# Patient Record
Sex: Female | Born: 1990 | Race: White | Hispanic: No | Marital: Single | Smoking: Former smoker
Health system: Southern US, Community
[De-identification: ages and names within clinical notes are randomized; demographics above are authoritative.]

## PROBLEM LIST (undated history)

## (undated) ENCOUNTER — Ambulatory Visit: Admission: EM | Source: Home / Self Care

## (undated) DIAGNOSIS — F1920 Other psychoactive substance dependence, uncomplicated: Secondary | ICD-10-CM

## (undated) DIAGNOSIS — E669 Obesity, unspecified: Secondary | ICD-10-CM

## (undated) DIAGNOSIS — O039 Complete or unspecified spontaneous abortion without complication: Secondary | ICD-10-CM

## (undated) DIAGNOSIS — O1490 Unspecified pre-eclampsia, unspecified trimester: Secondary | ICD-10-CM

## (undated) HISTORY — DX: Obesity, unspecified: E66.9

## (undated) HISTORY — DX: Other psychoactive substance dependence, uncomplicated: F19.20

## (undated) HISTORY — DX: Complete or unspecified spontaneous abortion without complication: O03.9

---
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
## 2012-03-09 ENCOUNTER — Emergency Department: Payer: Self-pay | Admitting: Emergency Medicine

## 2012-03-09 LAB — URINALYSIS, COMPLETE
Nitrite: NEGATIVE
Protein: NEGATIVE

## 2012-03-09 LAB — LIPASE, BLOOD: Lipase: 78 U/L (ref 73–393)

## 2012-03-09 LAB — COMPREHENSIVE METABOLIC PANEL
Bilirubin,Total: 0.4 mg/dL (ref 0.2–1.0)
Chloride: 110 mmol/L — ABNORMAL HIGH (ref 98–107)
Co2: 23 mmol/L (ref 21–32)
EGFR (African American): >60
EGFR (Non-African Amer.): >60
Glucose: 95 mg/dL (ref 65–99)
Osmolality: 281 (ref 275–301)
SGOT(AST): 20 U/L (ref 0–26)
SGPT (ALT): 26 U/L

## 2012-03-09 LAB — CBC
HCT: 35.8 % (ref 35.0–47.0)
HGB: 11.6 g/dL — ABNORMAL LOW (ref 12.0–16.0)
Platelet: 169 10*3/uL (ref 150–440)

## 2012-10-01 ENCOUNTER — Emergency Department: Payer: Self-pay | Admitting: Emergency Medicine

## 2013-04-08 ENCOUNTER — Emergency Department (HOSPITAL_COMMUNITY)
Admission: EM | Admit: 2013-04-08 | Discharge: 2013-04-08 | Disposition: A | Discharge status: Home / Self Care | Payer: Medicaid Other | Attending: Emergency Medicine | Admitting: Emergency Medicine

## 2013-04-08 ENCOUNTER — Encounter (HOSPITAL_COMMUNITY): Payer: Self-pay | Admitting: *Deleted

## 2013-04-08 DIAGNOSIS — W010XXA Fall on same level from slipping, tripping and stumbling without subsequent striking against object, initial encounter: Secondary | ICD-10-CM | POA: Insufficient documentation

## 2013-04-08 DIAGNOSIS — L0231 Cutaneous abscess of buttock: Secondary | ICD-10-CM | POA: Insufficient documentation

## 2013-04-08 DIAGNOSIS — IMO0002 Reserved for concepts with insufficient information to code with codable children: Secondary | ICD-10-CM | POA: Insufficient documentation

## 2013-04-08 DIAGNOSIS — F17200 Nicotine dependence, unspecified, uncomplicated: Secondary | ICD-10-CM | POA: Insufficient documentation

## 2013-04-08 DIAGNOSIS — L0291 Cutaneous abscess, unspecified: Secondary | ICD-10-CM

## 2013-04-08 DIAGNOSIS — Y939 Activity, unspecified: Secondary | ICD-10-CM | POA: Insufficient documentation

## 2013-04-08 DIAGNOSIS — Y929 Unspecified place or not applicable: Secondary | ICD-10-CM | POA: Insufficient documentation

## 2013-04-08 DIAGNOSIS — R6883 Chills (without fever): Secondary | ICD-10-CM | POA: Insufficient documentation

## 2013-04-08 MED ORDER — DIAZEPAM 5 MG PO TABS
5.0000 mg | ORAL_TABLET | Freq: Once | ORAL | Status: AC
  Administered 2013-04-08: 5 mg via ORAL
  Filled 2013-04-08: qty 1

## 2013-04-08 MED ORDER — IBUPROFEN 800 MG PO TABS: 800.0000 mg | ORAL_TABLET | Freq: Three times a day (TID) | ORAL | Status: DC

## 2013-04-08 MED ORDER — OXYCODONE-ACETAMINOPHEN 5-325 MG PO TABS: 1.0000 | ORAL_TABLET | Freq: Four times a day (QID) | ORAL | Status: DC | PRN

## 2013-04-08 MED ORDER — OXYCODONE-ACETAMINOPHEN 5-325 MG PO TABS
2.0000 | ORAL_TABLET | Freq: Once | ORAL | Status: AC
  Administered 2013-04-08: 2 via ORAL
  Filled 2013-04-08: qty 2

## 2013-04-08 NOTE — ED Provider Notes (Signed)
Medical screening examination/treatment/procedure(s) were conducted as a shared visit with non-physician practitioner(s) and myself.  I personally evaluated the patient during the encounter  Pt with gluteal abscess, likely perianal source.  Pt did fall onto tailbone about 2-3 days ago, possibly skin source after fall and had skin abrasion or break in skin.  Subjective chills.  Pt is not septic.  Will need I&D and follow up with PCP.  See PAC Beck's procedure note for further details.  Impression: Right Gluteal abscess  Bethany Ramsey. Tinleigh Whitmire, MD 04/08/13 1235

## 2013-04-08 NOTE — ED Notes (Signed)
C/o "sore that is hard" to right buttock area since yesterday. Area red, no drainage noted.

## 2013-04-08 NOTE — ED Notes (Signed)
Pt is here with left inner buttocks pain and reports she fell on her tailbone 2 days ago.  Area is red and tender.  Pt is tachycardic

## 2013-04-09 ENCOUNTER — Encounter (HOSPITAL_COMMUNITY): Payer: Self-pay

## 2013-04-09 ENCOUNTER — Emergency Department (HOSPITAL_COMMUNITY)
Admission: EM | Admit: 2013-04-09 | Discharge: 2013-04-09 | Disposition: A | Discharge status: Home / Self Care | Payer: Medicaid Other | Attending: Emergency Medicine | Admitting: Emergency Medicine

## 2013-04-09 DIAGNOSIS — L0231 Cutaneous abscess of buttock: Secondary | ICD-10-CM | POA: Insufficient documentation

## 2013-04-09 DIAGNOSIS — L03317 Cellulitis of buttock: Secondary | ICD-10-CM | POA: Insufficient documentation

## 2013-04-09 DIAGNOSIS — F17200 Nicotine dependence, unspecified, uncomplicated: Secondary | ICD-10-CM | POA: Insufficient documentation

## 2013-04-09 MED ORDER — SULFAMETHOXAZOLE-TMP DS 800-160 MG PO TABS: 1.0000 | ORAL_TABLET | Freq: Two times a day (BID) | ORAL | Status: DC

## 2013-04-09 MED ORDER — OXYCODONE-ACETAMINOPHEN 5-325 MG PO TABS: 1.0000 | ORAL_TABLET | Freq: Once | ORAL | Status: DC

## 2013-04-09 MED ORDER — SULFAMETHOXAZOLE-TMP DS 800-160 MG PO TABS
1.0000 | ORAL_TABLET | Freq: Once | ORAL | Status: AC
  Administered 2013-04-09: 1 via ORAL
  Filled 2013-04-09: qty 1

## 2013-04-09 MED ORDER — ETOMIDATE 2 MG/ML IV SOLN
INTRAVENOUS | Status: AC
  Filled 2013-04-09: qty 20

## 2013-04-09 MED ORDER — SUCCINYLCHOLINE CHLORIDE 20 MG/ML IJ SOLN
INTRAMUSCULAR | Status: AC
  Filled 2013-04-09: qty 1

## 2013-04-09 MED ORDER — ROCURONIUM BROMIDE 50 MG/5ML IV SOLN
INTRAVENOUS | Status: AC
  Filled 2013-04-09: qty 2

## 2013-04-09 MED ORDER — OXYCODONE-ACETAMINOPHEN 5-325 MG PO TABS
2.0000 | ORAL_TABLET | Freq: Once | ORAL | Status: AC
  Administered 2013-04-09: 2 via ORAL
  Filled 2013-04-09: qty 2

## 2013-04-09 MED ORDER — LIDOCAINE HCL (CARDIAC) 20 MG/ML IV SOLN
INTRAVENOUS | Status: AC
  Filled 2013-04-09: qty 5

## 2013-04-09 NOTE — ED Notes (Signed)
Would like her abscess rechecked, pt. Had it drained yesterday and was given only 2 percocets.  Continues to have sharp .  Pt. Needs pain medication.  Also feels that the abscess is firm to touch

## 2013-04-09 NOTE — ED Provider Notes (Signed)
History    This chart was scribed for non-physician practitioner, Arnoldo Hooker PA-C working with Glynn Octave, MD by Donne Anon, ED Scribe. This patient was seen in room TR11C/TR11C and the patient's care was started at 1545.   CSN: [id number]  Arrival date & time 04/09/13  1328   First MD Initiated Contact with Patient 04/09/13 1545      Chief Complaint  Patient presents with  . Abscess     The history is provided by the patient. No language interpreter was used.   HPI Comments: Bethany Ramsey is a 22 y.o. female who presents to the Emergency Department complaining of gradual onset, gradually worsening pain to her right buttock described as sharp. She was seen in the ED yesterday and had it drained but reports it was not packed. She reports she was only given 2 Percocet's which have provided little relief. She states she was not prescribed antibiotics. She reports she is otherwise healthy.    History reviewed. No pertinent past medical history.  History reviewed. No pertinent past surgical history.  No family history on file.  History  Substance Use Topics  . Smoking status: Current Every Day Smoker    Types: Cigarettes  . Smokeless tobacco: Not on file  . Alcohol Use: No    OB History   Grav Para Term Preterm Abortions TAB SAB Ect Mult Living                  Review of Systems  Skin: Positive for wound.  All other systems reviewed and are negative.    Allergies  Review of patient's allergies indicates no known allergies.  Home Medications   Current Outpatient Rx  Name  Route  Sig  Dispense  Refill  . ibuprofen (ADVIL,MOTRIN) 800 MG tablet   Oral   Take 1 tablet (800 mg total) by mouth 3 (three) times daily.   21 tablet   0   . oxyCODONE-acetaminophen (PERCOCET) 5-325 MG per tablet   Oral   Take 1 tablet by mouth every 6 (six) hours as needed for pain.   2 tablet   0     BP 145/90  Pulse 94  Temp(Src) 97.7 F (36.5 C) (Oral)  Resp 16   SpO2 100%  LMP 03/31/2013  Physical Exam  Nursing note and vitals reviewed. Constitutional: She is oriented to person, place, and time. She appears well-developed and well-nourished. No distress.  HENT:  Head: Normocephalic and atraumatic.  Eyes: EOM are normal.  Neck: Neck supple. No tracheal deviation present.  Cardiovascular: Normal rate.   Pulmonary/Chest: Effort normal. No respiratory distress.  Musculoskeletal: Normal range of motion.  Neurological: She is alert and oriented to person, place, and time.  Skin: Skin is warm and dry.  Pilonidal abscess right buttock that measures approximately 6x3 cm of induration. No active drainage. No surrounding cellulitic changes. Exquisitely tender to palpation.  Psychiatric: She has a normal mood and affect. Her behavior is normal.    ED Course  Procedures (including critical care time) DIAGNOSTIC STUDIES: Oxygen Saturation is 100% on RA, normal by my interpretation.    COORDINATION OF CARE: 3:56 PM Discussed treatment plan which includes I&D and antibiotics with pt at bedside and pt agreed to plan.  INCISION AND DRAINAGE Performed by: Arnoldo Hooker PA-C Consent: Verbal consent obtained. Risks and benefits: risks, benefits and alternatives were discussed Type: abscess  Body area: right buttock  Anesthesia: local infiltration  Incision was made with a scalpel.  Local anesthetic: lidocaine 2% with epinephrine  Anesthetic total: 2 ml  Complexity: complex Blunt dissection to break up loculations  Drainage: purulent  Drainage amount: large  Packing material:1inch in iodoform gauze  Patient tolerance: Patient tolerated the procedure well with no immediate complications.     Labs Reviewed - No data to display No results found.   No diagnosis found.  1. Cutaneous abscess  MDM  Uncomplicated cutaneous abscess      I personally performed the services described in this documentation, which was scribed in my  presence. The recorded information has been reviewed and is accurate.     Arnoldo Hooker, PA-C 04/09/13 1753

## 2013-04-10 NOTE — ED Provider Notes (Signed)
Medical screening examination/treatment/procedure(s) were performed by non-physician practitioner and as supervising physician I was immediately available for consultation/collaboration.  Glynn Octave, MD 04/10/13 [id number]

## 2013-04-11 ENCOUNTER — Emergency Department (HOSPITAL_COMMUNITY)
Admission: EM | Admit: 2013-04-11 | Discharge: 2013-04-11 | Disposition: A | Discharge status: Home / Self Care | Payer: Medicaid Other | Attending: Emergency Medicine | Admitting: Emergency Medicine

## 2013-04-11 ENCOUNTER — Encounter (HOSPITAL_COMMUNITY): Payer: Self-pay | Admitting: Emergency Medicine

## 2013-04-11 DIAGNOSIS — F17200 Nicotine dependence, unspecified, uncomplicated: Secondary | ICD-10-CM | POA: Insufficient documentation

## 2013-04-11 DIAGNOSIS — Z5189 Encounter for other specified aftercare: Secondary | ICD-10-CM

## 2013-04-11 DIAGNOSIS — Z4801 Encounter for change or removal of surgical wound dressing: Secondary | ICD-10-CM | POA: Insufficient documentation

## 2013-04-11 NOTE — ED Notes (Signed)
Pt states that she is here today because she needs to have her wound checked. The pt was here on Tuesday to get an abscess packed and dressed, and reports that she was told to come back today to have the packing taken out.

## 2013-04-11 NOTE — ED Provider Notes (Signed)
History  This chart was scribed for non-physician practitioner, Hector Shade, working with Dione Booze, MD by Ardeen Jourdain, ED Scribe. This patient was seen in room TR07C/TR07C and the patient's care was started at 1544.  CSN: [id number]  Arrival date & time 04/11/13  1328   First MD Initiated Contact with Patient 04/11/13 1544      Chief Complaint  Patient presents with  . Wound Check     The history is provided by the patient. No language interpreter was used.    HPI Comments: Galilea Quito is a 22 y.o. female who presents to the Emergency Department complaining of an abscess recheck. She states she was evaulated here in [facility] for the abscess 3 days ago. She was treated with an I&D and packing. She states she was told to return today to have the packing removed. She states she was prescribed Bactrim and is taking her medication as prescribed. She denies any nausea, emesis, diarrhea, fever and pain at the site as associated symptoms.   History reviewed. No pertinent past medical history.  History reviewed. No pertinent past surgical history.  History reviewed. No pertinent family history.  History  Substance Use Topics  . Smoking status: Current Every Day Smoker -- 1.00 packs/day    Types: Cigarettes  . Smokeless tobacco: Not on file  . Alcohol Use: No   No OB history available.   Review of Systems  Skin: Positive for wound.       Abscess recheck   All other systems reviewed and are negative.    Allergies  Review of patient's allergies indicates no known allergies.  Home Medications   Current Outpatient Rx  Name  Route  Sig  Dispense  Refill  . ibuprofen (ADVIL,MOTRIN) 800 MG tablet   Oral   Take 800 mg by mouth every 8 (eight) hours as needed for pain.         Marland Kitchen oxyCODONE-acetaminophen (PERCOCET/ROXICET) 5-325 MG per tablet   Oral   Take 1 tablet by mouth every 4 (four) hours as needed for pain.         Marland Kitchen sulfamethoxazole-trimethoprim (BACTRIM DS)  800-160 MG per tablet   Oral   Take 1 tablet by mouth 2 (two) times daily.           Triage Vitals: BP 127/59  Pulse 70  Temp(Src) 98.2 F (36.8 C)  SpO2 95%  LMP 03/31/2013  Physical Exam  Nursing note and vitals reviewed. Constitutional: She is oriented to person, place, and time. She appears well-developed and well-nourished. No distress.  HENT:  Head: Normocephalic and atraumatic.  Eyes: EOM are normal. Pupils are equal, round, and reactive to light.  Neck: Normal range of motion. Neck supple. No tracheal deviation present.  Cardiovascular: Normal rate, regular rhythm and normal heart sounds.  Exam reveals no gallop and no friction rub.   No murmur heard. Pulmonary/Chest: Effort normal and breath sounds normal. No respiratory distress. She has no wheezes. She has no rales. She exhibits no tenderness.  Abdominal: Soft. Bowel sounds are normal. She exhibits no distension. There is no tenderness.  Musculoskeletal: Normal range of motion. She exhibits no edema.  Neurological: She is alert and oriented to person, place, and time.  Skin: Skin is warm and dry. She is not diaphoretic.  Well healing open abscess with linear incision to right buttock in gluteal cleft. Scant amount of serosanguineous drainage. No warmth or erythema or tenderness to area  Psychiatric: She has a normal mood  and affect. Her behavior is normal.    ED Course  Procedures (including critical care time)  DIAGNOSTIC STUDIES: Oxygen Saturation is 95% on room air, adequate by my interpretation.    COORDINATION OF CARE:  3:56 PM-Discussed treatment plan which includes instructions for home care with pt at bedside and pt agreed to plan.    Labs Reviewed - No data to display No results found.   1. Wound check, abscess       MDM  Abscess appears to be healing well.  No packing was visualized on exam.  Open linear incision draining scant seroanguinous fluid.  No signs of cellulitis. No warmth,  erythema, or tenderness.  Pt still taking and tolerating bactrim.  Discharged home.  No need for f/u unless symptoms return.  Finish bactrim prescription and warm bath soaks to help remaining drainage from abscess. Pt verbalized understanding and agreement with tx plan.  Vitals: unremarkable. Discharged in stable condition.    I personally performed the services described in this documentation, which was scribed in my presence. The recorded information has been reviewed and is accurate.   Junius Finner, PA-C 04/12/13 1541

## 2013-04-13 NOTE — ED Provider Notes (Signed)
Medical screening examination/treatment/procedure(s) were performed by non-physician practitioner and as supervising physician I was immediately available for consultation/collaboration.  Mackenze Grandison, MD 04/13/13 0022 

## 2013-04-16 NOTE — ED Provider Notes (Signed)
Medical screening examination/treatment/procedure(s) were performed by non-physician practitioner and as supervising physician I was immediately available for consultation/collaboration.   Krysti Hickling Y. Oney Folz, MD 04/16/13 1507 

## 2013-04-16 NOTE — ED Provider Notes (Signed)
History     CSN: [id number]  Arrival date & time 04/08/13  6213   First MD Initiated Contact with Patient 04/08/13 1008      Chief Complaint  Patient presents with  . Abscess    (Consider location/radiation/quality/duration/timing/severity/associated sxs/prior treatment) HPI Comments: 22 y.o. Female with no significant medical hx presents today complaining of pain to the tailbone area s/p slipping and falling on her tailbone two days ago. New problem. Acute onset, severe pain, gradually worsening. Pt states it hurts to sit or lay down on her back. Pt admits regular daily bowel movements and subjective chills. Denies fever, hematochezia, nausea, vomiting, numbness, loss of bowel/bladder control, saddle paresthesia. Pt has taken no interventions for this problem.   Patient is a 22 y.o. female presenting with abscess. The history is provided by the patient.  Abscess Location:  Ano-genital Ano-genital abscess location:  R buttock ((note correction to nursing note)) Size:  3cm x 6cm Abscess quality: fluctuance, painful and redness   Abscess quality: not draining, no induration, no itching, no warmth and not weeping   Red streaking: no   Duration:  2 days Progression:  Worsening Pain details:    Quality:  Sharp   Severity:  Severe   Duration:  2 days   Timing:  Constant   Progression:  Worsening Chronicity:  New Context: skin injury   Relieved by:  None tried Exacerbated by: siting, laying down on back. Ineffective treatments:  None tried Associated symptoms: no fever, no headaches, no nausea and no vomiting   Associated symptoms comment:  Subjective chills Risk factors: no hx of MRSA and no prior abscess     History reviewed. No pertinent past medical history.  History reviewed. No pertinent past surgical history.  No family history on file.  History  Substance Use Topics  . Smoking status: Current Every Day Smoker -- 1.00 packs/day    Types: Cigarettes  . Smokeless  tobacco: Not on file  . Alcohol Use: No    OB History   Grav Para Term Preterm Abortions TAB SAB Ect Mult Living                  Review of Systems  Constitutional: Positive for chills. Negative for fever and diaphoresis.  HENT: Negative for neck pain and neck stiffness.   Eyes: Negative for visual disturbance.  Respiratory: Negative for apnea, chest tightness and shortness of breath.   Cardiovascular: Negative for chest pain and palpitations.  Gastrointestinal: Negative for nausea, vomiting, diarrhea and constipation.  Genitourinary: Negative for dysuria.  Musculoskeletal: Positive for back pain. Negative for gait problem.       Tailbone pain s/p fall  Skin:       Red, tender, painful abscess to right buttock  Neurological: Negative for dizziness, weakness, light-headedness, numbness and headaches.    Allergies  Review of patient's allergies indicates no known allergies.  Home Medications   Current Outpatient Rx  Name  Route  Sig  Dispense  Refill  . ibuprofen (ADVIL,MOTRIN) 800 MG tablet   Oral   Take 800 mg by mouth every 8 (eight) hours as needed for pain.         Marland Kitchen oxyCODONE-acetaminophen (PERCOCET/ROXICET) 5-325 MG per tablet   Oral   Take 1 tablet by mouth every 4 (four) hours as needed for pain.         Marland Kitchen sulfamethoxazole-trimethoprim (BACTRIM DS) 800-160 MG per tablet   Oral   Take 1 tablet by mouth 2 (two)  times daily.           BP 109/63  Pulse 75  Temp(Src) 98.9 F (37.2 C) (Oral)  Resp 16  SpO2 99%  LMP 03/31/2013  Physical Exam  Nursing note and vitals reviewed. Constitutional: She is oriented to person, place, and time. She appears well-developed and well-nourished. No distress.  HENT:  Head: Normocephalic and atraumatic.  Eyes: Conjunctivae and EOM are normal.  Neck: Normal range of motion. Neck supple.  No meningeal signs  Cardiovascular: Normal rate, normal heart sounds and intact distal pulses.  Exam reveals no gallop and no  friction rub.   No murmur heard. Tachycardic to 120s  Pulmonary/Chest: Effort normal and breath sounds normal. No respiratory distress. She has no wheezes. She has no rales. She exhibits no tenderness.  Abdominal: Soft. Bowel sounds are normal. She exhibits no distension. There is no tenderness. There is no rebound and no guarding.  Musculoskeletal: Normal range of motion. She exhibits no edema and no tenderness.  FROM to upper and lower extremities No step-offs noted on C-spine No tenderness to palpation of the spinous processes of the C-spine, T-spine or L-spine Full range of motion of C-spine, T-spine or L-spine   Neurological: She is alert and oriented to person, place, and time. No cranial nerve deficit.  Speech is clear and goal oriented, follows commands Sensation normal to light touch Moves extremities without ataxia, coordination intact Normal gait and balance Normal strength in upper and lower extremities bilaterally including dorsiflexion and plantar flexion, strong and equal grip strength   Skin: Skin is warm, dry and intact. She is not diaphoretic. There is erythema.     3cm x 6cm fluctuant abscess. Fluid pockets noted with ultrasound. Erythematous. Tender to palpation. No weeping. No warmth. No edema. No red streaking.  Psychiatric:  anxious    ED Course  Procedures (including critical care time) INCISION AND DRAINAGE Performed by: Glade Nurse Consent: Verbal consent obtained. Risks and benefits: risks, benefits and alternatives were discussed Type: abscess  Body area: right gluteal fold  Anesthesia: local infiltration  Incision was made with a scalpel.  Local anesthetic: lidocaine 2% w epinephrine  Anesthetic total: 6 ml  Complexity: complex Blunt dissection to break up loculations  Drainage: serosanguineous  Drainage amount: moderate  Packing material: none  Patient tolerance: Patient tolerated the procedure well with no immediate  complications.    Labs Reviewed - No data to display No results found.   1. Abscess       MDM  21 y.o. Female with no significant medical hx presents today complaining of pain to the tailbone area s/p slipping and falling on her tailbone two days ago. Not immediately apparent on physical exam, closer examination and use of ultrasound did detect fluid pockets under the surface of the skin of the right gluteal fold. 3cm x 6cm area amenable to I&D. Moderate amount of serosanguineous fluid drained. No signs of infection. No abx needed at this time. Pain managed in ED. Pt tolerated procedure well. On re-evaluation, pt HR in the 80s. Pt given pain meds, instructions to follow up with PCP. Care instructions included use of sanitary napkins to absorb remaining drainage (also given in ED), sitz baths, and thorough cleaning with bowel movements.  Discussed reasons to seek immediate care. Patient expresses understanding and agrees with plan. Pt case seen by and discussed with Dr. Oletta Lamas who agrees with plan.    Glade Nurse, PA-C 04/16/13 1358

## 2014-02-24 ENCOUNTER — Emergency Department: Payer: Self-pay | Admitting: Emergency Medicine

## 2014-02-24 LAB — URINALYSIS, COMPLETE
BACTERIA: NONE SEEN
Bilirubin,UR: NEGATIVE
Glucose,UR: NEGATIVE mg/dL (ref 0–75)
Ketone: NEGATIVE
NITRITE: NEGATIVE
Ph: 6 (ref 4.5–8.0)
Protein: NEGATIVE
RBC,UR: 4 /HPF (ref 0–5)
Specific Gravity: 1.021 (ref 1.003–1.030)
WBC UR: 16 /HPF (ref 0–5)

## 2014-02-24 LAB — GC/CHLAMYDIA PROBE AMP

## 2014-02-24 LAB — PREGNANCY, URINE: Pregnancy Test, Urine: NEGATIVE m[IU]/mL

## 2014-02-24 LAB — WET PREP, GENITAL

## 2014-11-26 ENCOUNTER — Emergency Department: Payer: Self-pay | Admitting: Emergency Medicine

## 2014-11-26 LAB — COMPREHENSIVE METABOLIC PANEL
ALBUMIN: 4.1 g/dL (ref 3.4–5.0)
ALK PHOS: 84 U/L
AST: 33 U/L (ref 15–37)
Anion Gap: 7 (ref 7–16)
BUN: 9 mg/dL (ref 7–18)
Bilirubin,Total: 1 mg/dL (ref 0.2–1.0)
CALCIUM: 9.1 mg/dL (ref 8.5–10.1)
CO2: 24 mmol/L (ref 21–32)
Chloride: 107 mmol/L (ref 98–107)
Creatinine: 0.7 mg/dL (ref 0.60–1.30)
EGFR (African American): >60
GLUCOSE: 106 mg/dL — AB (ref 65–99)
Osmolality: 275 (ref 275–301)
Potassium: 3.8 mmol/L (ref 3.5–5.1)
SGPT (ALT): 53 U/L
Sodium: 138 mmol/L (ref 136–145)
Total Protein: 7.4 g/dL (ref 6.4–8.2)

## 2014-11-26 LAB — CBC
HCT: 42.4 % (ref 35.0–47.0)
HGB: 14.1 g/dL (ref 12.0–16.0)
MCH: 31 pg (ref 26.0–34.0)
MCHC: 33.2 g/dL (ref 32.0–36.0)
MCV: 93 fL (ref 80–100)
Platelet: 250 10*3/uL (ref 150–440)
RBC: 4.54 10*6/uL (ref 3.80–5.20)
RDW: 12.9 % (ref 11.5–14.5)
WBC: 9.6 10*3/uL (ref 3.6–11.0)

## 2016-03-11 ENCOUNTER — Emergency Department: Payer: Medicaid Other

## 2016-03-11 ENCOUNTER — Emergency Department
Admission: EM | Admit: 2016-03-11 | Discharge: 2016-03-11 | Disposition: A | Discharge status: Home / Self Care | Payer: Medicaid Other | Attending: Emergency Medicine | Admitting: Emergency Medicine

## 2016-03-11 ENCOUNTER — Encounter: Payer: Self-pay | Admitting: Emergency Medicine

## 2016-03-11 DIAGNOSIS — Z79899 Other long term (current) drug therapy: Secondary | ICD-10-CM | POA: Insufficient documentation

## 2016-03-11 DIAGNOSIS — R0789 Other chest pain: Secondary | ICD-10-CM

## 2016-03-11 DIAGNOSIS — F1290 Cannabis use, unspecified, uncomplicated: Secondary | ICD-10-CM | POA: Insufficient documentation

## 2016-03-11 DIAGNOSIS — F17210 Nicotine dependence, cigarettes, uncomplicated: Secondary | ICD-10-CM | POA: Insufficient documentation

## 2016-03-11 DIAGNOSIS — I10 Essential (primary) hypertension: Secondary | ICD-10-CM | POA: Insufficient documentation

## 2016-03-11 LAB — BASIC METABOLIC PANEL
Anion gap: 11 (ref 5–15)
BUN: 9 mg/dL (ref 6–20)
CALCIUM: 9.7 mg/dL (ref 8.9–10.3)
CHLORIDE: 106 mmol/L (ref 101–111)
CO2: 24 mmol/L (ref 22–32)
CREATININE: 0.61 mg/dL (ref 0.44–1.00)
GFR calc Af Amer: >60 mL/min (ref >60)
GFR calc non Af Amer: >60 mL/min (ref >60)
Glucose, Bld: 100 mg/dL — ABNORMAL HIGH (ref 65–99)
Potassium: 4.2 mmol/L (ref 3.5–5.1)
SODIUM: 141 mmol/L (ref 135–145)

## 2016-03-11 LAB — CBC
HCT: 42.6 % (ref 35.0–47.0)
Hemoglobin: 14.3 g/dL (ref 12.0–16.0)
MCH: 30.7 pg (ref 26.0–34.0)
MCHC: 33.6 g/dL (ref 32.0–36.0)
MCV: 91.6 fL (ref 80.0–100.0)
PLATELETS: 235 10*3/uL (ref 150–440)
RBC: 4.65 MIL/uL (ref 3.80–5.20)
RDW: 12.9 % (ref 11.5–14.5)
WBC: 9.1 10*3/uL (ref 3.6–11.0)

## 2016-03-11 LAB — TROPONIN I: Troponin I: <0.03 ng/mL (ref <0.031)

## 2016-03-11 MED ORDER — NAPROXEN 500 MG PO TABS: 500.0000 mg | ORAL_TABLET | Freq: Two times a day (BID) | ORAL | Status: DC

## 2016-03-11 MED ORDER — LISINOPRIL-HYDROCHLOROTHIAZIDE 10-12.5 MG PO TABS: 1.0000 | ORAL_TABLET | Freq: Every day | ORAL | Status: DC

## 2016-03-11 NOTE — ED Provider Notes (Signed)
[facility] Emergency Department Provider Note  ____________________________________________    I have reviewed the triage vital signs and the nursing notes.   HISTORY  Chief Complaint Chest Pain    HPI Bethany Ramsey is a 25 y.o. female who presents with complaints of central chest aching sensation that is mild to moderate. It seems to get worse when she takes deep breath. It is been going on for approximately 2 days. No injury to the area. No recent illness. No shortness of breath. No calf pain. No history of DVT. She is not on blood clots. She has not been taking her blood pressure medication for her known hypertension. No headache. Patient is currently menstruating   Past Medical History  Diagnosis Date  . Hypertension     There are no active problems to display for this patient.   History reviewed. No pertinent past surgical history.  Current Outpatient Rx  Name  Route  Sig  Dispense  Refill  . ibuprofen (ADVIL,MOTRIN) 800 MG tablet   Oral   Take 800 mg by mouth every 8 (eight) hours as needed for pain.         Marland Kitchen lisinopril-hydrochlorothiazide (PRINZIDE,ZESTORETIC) 10-12.5 MG tablet   Oral   Take 1 tablet by mouth daily.   30 tablet   0   . naproxen (NAPROSYN) 500 MG tablet   Oral   Take 1 tablet (500 mg total) by mouth 2 (two) times daily with a meal.   20 tablet   2   . oxyCODONE-acetaminophen (PERCOCET/ROXICET) 5-325 MG per tablet   Oral   Take 1 tablet by mouth every 4 (four) hours as needed for pain.         Marland Kitchen sulfamethoxazole-trimethoprim (BACTRIM DS) 800-160 MG per tablet   Oral   Take 1 tablet by mouth 2 (two) times daily.           Allergies Review of patient's allergies indicates no known allergies.  History reviewed. No pertinent family history.  Social History Social History  Substance Use Topics  . Smoking status: Current Every Day Smoker -- 1.00 packs/day    Types: Cigarettes  . Smokeless tobacco: None   . Alcohol Use: No    Review of Systems  Constitutional: Negative for fever. Eyes: Negative for redness ENT: Negative for sore throat Cardiovascular: As above Respiratory: Negative for shortness of breath. Gastrointestinal: Negative for abdominal pain Genitourinary: Negative for dysuria. Musculoskeletal: Negative for back pain. Skin: Negative for rash. Neurological: Negative for focal weakness, no headache Psychiatric: no anxiety    ____________________________________________   PHYSICAL EXAM:  VITAL SIGNS: ED Triage Vitals  Enc Vitals Group     BP 03/11/16 1203 186/109 mmHg     Pulse Rate 03/11/16 1203 93     Resp 03/11/16 1203 20     Temp 03/11/16 1203 98.4 F (36.9 C)     Temp Source 03/11/16 1203 Oral     SpO2 03/11/16 1203 98 %     Weight 03/11/16 1203 250 lb (113.399 kg)     Height 03/11/16 1203  (1.753 m)     Head Cir --      Peak Flow --      Pain Score 03/11/16 1205 0     Pain Loc --      Pain Edu? --      Excl. in GC? --     Constitutional: Alert and oriented. Well appearing and in no distress.  Eyes: Conjunctivae are normal. No  erythema or injection ENT   Head: Normocephalic and atraumatic.   Mouth/Throat: Mucous membranes are moist. Cardiovascular: Normal rate, regular rhythm. Normal and symmetric distal pulses are present in the upper extremities. No murmurs or rubs , bilateral sternal tenderness to palpation that mimics her discomfort. Respiratory: Normal respiratory effort without tachypnea nor retractions. Breath sounds are clear and equal bilaterally.  Gastrointestinal: Soft and non-tender in all quadrants. No distention. There is no CVA tenderness. Genitourinary: deferred Musculoskeletal: Nontender with normal range of motion in all extremities. No lower extremity tenderness nor edema.  Neurologic:  Normal speech and language. No gross focal neurologic deficits are appreciated. Skin:  Skin is warm, dry and intact. No rash  noted. Psychiatric: Mood and affect are normal. Patient exhibits appropriate insight and judgment.  ____________________________________________    LABS (pertinent positives/negatives)  Labs Reviewed  BASIC METABOLIC PANEL - Abnormal; Notable for the following:    Glucose, Bld 100 (*)    All other components within normal limits  CBC  TROPONIN I    ____________________________________________   EKG  ED ECG REPORT I, Jene EveryKINNER, Norma Montemurro, the attending physician, personally viewed and interpreted this ECG.  Date: 03/11/2016 EKG Time: 12:01 PM Rate: 86 Rhythm: normal sinus rhythm QRS Axis: normal Intervals: normal ST/T Wave abnormalities: normal Conduction Disturbances: none Narrative Interpretation: unremarkable   ____________________________________________    RADIOLOGY  Chest x-ray is normal ____________________________________________   PROCEDURES  Procedure(s) performed: none  Critical Care performed: none  ____________________________________________   INITIAL IMPRESSION / ASSESSMENT AND PLAN / ED COURSE  Pertinent labs & imaging results that were available during my care of the patient were reviewed by me and considered in my medical decision making (see chart for details).  Patient presents with chest wall discomfort for approximately 2 days. Her EKG is normal. Her lab work is normal. PERC negative. She has tenderness to her chest wall and mild pleurisy in the area which makes me suspicious for costochondritis/chest wall pain. She has a history of hypertension and is not on her blood pressure medication. I discussed this with her and I will prescribe her her typical medication so that she started again. We will treat her chest pain with NSAIDs. I have asked her to follow up with her PCP or if any worsening to return to the emergency department immediately and she agrees with this plan.  ____________________________________________   FINAL CLINICAL  IMPRESSION(S) / ED DIAGNOSES  Final diagnoses:  Acute chest wall pain          Jene Everyobert Anoushka Divito, MD 03/11/16 1606

## 2016-03-11 NOTE — ED Notes (Signed)
Pt to ed with c/o central chest pain that started yesterday,  Pt states intermittent sharp and shooting chest pains throughout the day with associated sob, denies weakness, denies dizziness, denies diaphoresis, denies n/v.  Pt states she is supposed to be on bp meds (lisinopril/HCTZ) but stopped them over 1 year ago.

## 2016-03-11 NOTE — Discharge Instructions (Signed)

## 2016-04-14 ENCOUNTER — Emergency Department
Admission: EM | Admit: 2016-04-14 | Discharge: 2016-04-14 | Disposition: A | Discharge status: Home / Self Care | Payer: No Typology Code available for payment source | Attending: Emergency Medicine | Admitting: Emergency Medicine

## 2016-04-14 ENCOUNTER — Emergency Department: Payer: No Typology Code available for payment source

## 2016-04-14 DIAGNOSIS — R51 Headache: Secondary | ICD-10-CM | POA: Diagnosis not present

## 2016-04-14 DIAGNOSIS — F1290 Cannabis use, unspecified, uncomplicated: Secondary | ICD-10-CM | POA: Insufficient documentation

## 2016-04-14 DIAGNOSIS — S0033XA Contusion of nose, initial encounter: Secondary | ICD-10-CM | POA: Diagnosis not present

## 2016-04-14 DIAGNOSIS — S00531A Contusion of lip, initial encounter: Secondary | ICD-10-CM | POA: Insufficient documentation

## 2016-04-14 DIAGNOSIS — Y9389 Activity, other specified: Secondary | ICD-10-CM | POA: Insufficient documentation

## 2016-04-14 DIAGNOSIS — Z79899 Other long term (current) drug therapy: Secondary | ICD-10-CM | POA: Diagnosis not present

## 2016-04-14 DIAGNOSIS — S0992XA Unspecified injury of nose, initial encounter: Secondary | ICD-10-CM | POA: Diagnosis present

## 2016-04-14 DIAGNOSIS — F17210 Nicotine dependence, cigarettes, uncomplicated: Secondary | ICD-10-CM | POA: Insufficient documentation

## 2016-04-14 DIAGNOSIS — Y999 Unspecified external cause status: Secondary | ICD-10-CM | POA: Diagnosis not present

## 2016-04-14 DIAGNOSIS — Y92410 Unspecified street and highway as the place of occurrence of the external cause: Secondary | ICD-10-CM | POA: Diagnosis not present

## 2016-04-14 DIAGNOSIS — I10 Essential (primary) hypertension: Secondary | ICD-10-CM | POA: Insufficient documentation

## 2016-04-14 MED ORDER — KETOROLAC TROMETHAMINE 30 MG/ML IJ SOLN
30.0000 mg | Freq: Once | INTRAMUSCULAR | Status: AC
  Administered 2016-04-14: 30 mg via INTRAMUSCULAR
  Filled 2016-04-14: qty 1

## 2016-04-14 MED ORDER — HYDROCODONE-ACETAMINOPHEN 5-325 MG PO TABS
1.0000 | ORAL_TABLET | Freq: Once | ORAL | Status: AC
  Administered 2016-04-14: 1 via ORAL
  Filled 2016-04-14: qty 1

## 2016-04-14 MED ORDER — HYDROCODONE-ACETAMINOPHEN 5-325 MG PO TABS: 1.0000 | ORAL_TABLET | Freq: Four times a day (QID) | ORAL | Status: DC | PRN

## 2016-04-14 MED ORDER — IBUPROFEN 600 MG PO TABS: 600.0000 mg | ORAL_TABLET | Freq: Four times a day (QID) | ORAL | Status: DC | PRN
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
## 2018-04-11 ENCOUNTER — Other Ambulatory Visit: Payer: Self-pay

## 2018-04-11 ENCOUNTER — Encounter: Payer: Self-pay | Admitting: Emergency Medicine

## 2018-04-11 ENCOUNTER — Emergency Department
Admission: EM | Admit: 2018-04-11 | Discharge: 2018-04-11 | Disposition: A | Discharge status: Home / Self Care | Payer: Self-pay | Attending: Emergency Medicine | Admitting: Emergency Medicine

## 2018-04-11 DIAGNOSIS — Y9389 Activity, other specified: Secondary | ICD-10-CM | POA: Insufficient documentation

## 2018-04-11 DIAGNOSIS — Z79899 Other long term (current) drug therapy: Secondary | ICD-10-CM | POA: Insufficient documentation

## 2018-04-11 DIAGNOSIS — Y999 Unspecified external cause status: Secondary | ICD-10-CM | POA: Insufficient documentation

## 2018-04-11 DIAGNOSIS — F17210 Nicotine dependence, cigarettes, uncomplicated: Secondary | ICD-10-CM | POA: Insufficient documentation

## 2018-04-11 DIAGNOSIS — T310 Burns involving less than 10% of body surface: Secondary | ICD-10-CM | POA: Insufficient documentation

## 2018-04-11 DIAGNOSIS — X19XXXA Contact with other heat and hot substances, initial encounter: Secondary | ICD-10-CM | POA: Insufficient documentation

## 2018-04-11 DIAGNOSIS — Y929 Unspecified place or not applicable: Secondary | ICD-10-CM | POA: Insufficient documentation

## 2018-04-11 DIAGNOSIS — I10 Essential (primary) hypertension: Secondary | ICD-10-CM | POA: Insufficient documentation

## 2018-04-11 DIAGNOSIS — T24201A Burn of second degree of unspecified site of right lower limb, except ankle and foot, initial encounter: Secondary | ICD-10-CM

## 2018-04-11 DIAGNOSIS — T24221A Burn of second degree of right knee, initial encounter: Secondary | ICD-10-CM | POA: Insufficient documentation

## 2018-04-11 MED ORDER — IBUPROFEN 600 MG PO TABS: 600.0000 mg | ORAL_TABLET | Freq: Three times a day (TID) | ORAL | 0 refills | Status: DC | PRN

## 2018-04-11 MED ORDER — SILVER SULFADIAZINE 1 % EX CREA
TOPICAL_CREAM | Freq: Once | CUTANEOUS | Status: AC
  Administered 2018-04-11: 1 via TOPICAL

## 2018-04-11 MED ORDER — SILVER SULFADIAZINE 1 % EX CREA: TOPICAL_CREAM | CUTANEOUS | 0 refills | Status: DC

## 2018-04-11 MED ORDER — OXYCODONE-ACETAMINOPHEN 5-325 MG PO TABS
1.0000 | ORAL_TABLET | Freq: Once | ORAL | Status: AC
  Administered 2018-04-11: 1 via ORAL
  Filled 2018-04-11: qty 1

## 2018-04-11 MED ORDER — IBUPROFEN 600 MG PO TABS
600.0000 mg | ORAL_TABLET | Freq: Once | ORAL | Status: AC
  Administered 2018-04-11: 600 mg via ORAL
  Filled 2018-04-11: qty 1

## 2018-04-11 MED ORDER — SILVER SULFADIAZINE 1 % EX CREA
TOPICAL_CREAM | CUTANEOUS | Status: AC
  Filled 2018-04-11: qty 85

## 2018-04-11 MED ORDER — TRAMADOL HCL 50 MG PO TABS: 50.0000 mg | ORAL_TABLET | Freq: Two times a day (BID) | ORAL | 0 refills | Status: DC | PRN
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
## 2018-04-11 NOTE — ED Notes (Signed)
Burn area cleansed with saline   silvadene dressing applied  Tolerated well
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
## 2018-04-12 ENCOUNTER — Other Ambulatory Visit: Payer: Self-pay

## 2018-04-12 ENCOUNTER — Emergency Department
Admission: EM | Admit: 2018-04-12 | Discharge: 2018-04-12 | Disposition: A | Discharge status: Home / Self Care | Payer: Self-pay | Attending: Emergency Medicine | Admitting: Emergency Medicine

## 2018-04-12 ENCOUNTER — Encounter: Payer: Self-pay | Admitting: Emergency Medicine

## 2018-04-12 DIAGNOSIS — F17210 Nicotine dependence, cigarettes, uncomplicated: Secondary | ICD-10-CM | POA: Insufficient documentation

## 2018-04-12 DIAGNOSIS — Z5189 Encounter for other specified aftercare: Secondary | ICD-10-CM

## 2018-04-12 DIAGNOSIS — Z4800 Encounter for change or removal of nonsurgical wound dressing: Secondary | ICD-10-CM | POA: Insufficient documentation

## 2018-04-12 DIAGNOSIS — X58XXXA Exposure to other specified factors, initial encounter: Secondary | ICD-10-CM | POA: Insufficient documentation

## 2018-04-12 DIAGNOSIS — Z79899 Other long term (current) drug therapy: Secondary | ICD-10-CM | POA: Insufficient documentation

## 2018-04-12 DIAGNOSIS — I10 Essential (primary) hypertension: Secondary | ICD-10-CM | POA: Insufficient documentation

## 2018-04-12 MED ORDER — SILVER SULFADIAZINE 1 % EX CREA
TOPICAL_CREAM | CUTANEOUS | Status: AC
  Administered 2018-04-12: 1 via TOPICAL
  Filled 2018-04-12: qty 85

## 2018-04-12 MED ORDER — SILVER SULFADIAZINE 1 % EX CREA
TOPICAL_CREAM | Freq: Once | CUTANEOUS | Status: AC
  Administered 2018-04-12: 1 via TOPICAL

## 2018-04-12 MED ORDER — OXYCODONE-ACETAMINOPHEN 5-325 MG PO TABS: 1.0000 | ORAL_TABLET | Freq: Four times a day (QID) | ORAL | 0 refills | Status: DC | PRN

## 2018-04-12 MED ORDER — SULFAMETHOXAZOLE-TRIMETHOPRIM 800-160 MG PO TABS: 1.0000 | ORAL_TABLET | Freq: Two times a day (BID) | ORAL | 0 refills | Status: DC

## 2018-04-12 MED ORDER — SULFAMETHOXAZOLE-TRIMETHOPRIM 800-160 MG PO TABS
1.0000 | ORAL_TABLET | Freq: Once | ORAL | Status: AC
Start: 2018-04-12 — End: 2018-04-12
  Administered 2018-04-12: 1 via ORAL
  Filled 2018-04-12: qty 1

## 2018-04-12 MED ORDER — OXYCODONE-ACETAMINOPHEN 5-325 MG PO TABS
1.0000 | ORAL_TABLET | Freq: Once | ORAL | Status: AC
  Administered 2018-04-12: 1 via ORAL
  Filled 2018-04-12: qty 1

## 2018-04-12 NOTE — ED Provider Notes (Signed)
[facility] Emergency Department Provider Note   ____________________________________________   First MD Initiated Contact with Patient 04/12/18 1356     (approximate)  I have reviewed the triage vital signs and the nursing notes.   HISTORY  Chief Complaint Wound Check    HPI Bethany Ramsey is a 27 y.o. female patient here for recheck of burn to the left leg.  Patient initially seen this department yesterday which was 3 day status post injury.  Patient state increased redness and swelling.  Patient state following discharge care instruction and using Silvadene as directed.  Patient rates pain as a 7/10.  Patient described the pain is "sore".  Past Medical History:  Diagnosis Date  . Hypertension     There are no active problems to display for this patient.   History reviewed. No pertinent surgical history.  Prior to Admission medications   Medication Sig Start Date End Date Taking? Authorizing Provider  HYDROcodone-acetaminophen (NORCO) 5-325 MG tablet Take 1 tablet by mouth every 6 (six) hours as needed for severe pain. 04/14/16   Hagler, Jami L, PA-C  ibuprofen (ADVIL,MOTRIN) 600 MG tablet Take 1 tablet (600 mg total) by mouth every 6 (six) hours as needed. 04/14/16   Hagler, Jami L, PA-C  ibuprofen (ADVIL,MOTRIN) 600 MG tablet Take 1 tablet (600 mg total) by mouth every 8 (eight) hours as needed. 04/11/18   Joni Reining, PA-C  lisinopril-hydrochlorothiazide (PRINZIDE,ZESTORETIC) 10-12.5 MG tablet Take 1 tablet by mouth daily. 03/11/16   Jene Every, MD  naproxen (NAPROSYN) 500 MG tablet Take 1 tablet (500 mg total) by mouth 2 (two) times daily with a meal. 03/11/16   Jene Every, MD  oxyCODONE-acetaminophen (PERCOCET/ROXICET) 5-325 MG per tablet Take 1 tablet by mouth every 4 (four) hours as needed for pain.    [provider]  silver sulfADIAZINE (SILVADENE) 1 % cream Apply to affected area daily 04/11/18 04/11/19  Joni Reining, PA-C    sulfamethoxazole-trimethoprim (BACTRIM DS) 800-160 MG per tablet Take 1 tablet by mouth 2 (two) times daily.    [provider]  sulfamethoxazole-trimethoprim (BACTRIM DS,SEPTRA DS) 800-160 MG tablet Take 1 tablet by mouth 2 (two) times daily. 04/12/18   Joni Reining, PA-C  traMADol (ULTRAM) 50 MG tablet Take 1 tablet (50 mg total) by mouth every 12 (twelve) hours as needed. 04/11/18   Joni Reining, PA-C    Allergies Patient has no known allergies.  No family history on file.  Social History Social History   Tobacco Use  . Smoking status: Current Every Day Smoker    Packs/day: 1.00    Types: Cigarettes  . Smokeless tobacco: Never Used  Substance Use Topics  . Alcohol use: No  . Drug use: Yes    Types: Marijuana    Review of Systems Constitutional: No fever/chills Eyes: No visual changes. ENT: No sore throat. Cardiovascular: Denies chest pain. Respiratory: Denies shortness of breath. Gastrointestinal: No abdominal pain.  No nausea, no vomiting.  No diarrhea.  No constipation. Genitourinary: Negative for dysuria. Musculoskeletal: Negative for back pain. Skin: Ruptured bullous lesion on erythematous base. Neurological: Negative for headaches, focal weakness or numbness.   ____________________________________________   PHYSICAL EXAM:  VITAL SIGNS: ED Triage Vitals  Enc Vitals Group     BP 04/12/18 1357 (!) 159/108     Pulse Rate 04/12/18 1357 (!) 103     Resp 04/12/18 1357 18     Temp 04/12/18 1357 98.4 F (36.9 C)  Temp Source 04/12/18 1357 Oral     SpO2 04/12/18 1357 99 %     Weight 04/12/18 1356 182 lb (82.6 kg)     Height 04/12/18 1356  (1.753 m)     Head Circumference --      Peak Flow --      Pain Score 04/12/18 1400 7     Pain Loc --      Pain Edu? --      Excl. in GC? --     Constitutional: Alert and oriented. Well appearing and in no acute distress. Cardiovascular: Normal rate, regular rhythm. Grossly normal heart sounds.   Good peripheral circulation. Respiratory: Normal respiratory effort.  No retractions. Lungs CTAB. Neurologic:  Normal speech and language. No gross focal neurologic deficits are appreciated. No gait instability. Skin: Wound is not  covered and no Silvadene ointment present.  Patient states she removed the ointment prior to arrival so we can see the redness.   Psychiatric: Mood and affect are normal. Speech and behavior are normal.  ____________________________________________   LABS (all labs ordered are listed, but only abnormal results are displayed)  Labs Reviewed - No data to display ____________________________________________  EKG   ____________________________________________  RADIOLOGY  ED MD interpretation:    Official radiology report(s): No results found.  ____________________________________________   PROCEDURES  Procedure(s) performed: None  Procedures  Critical Care performed: No  ____________________________________________   INITIAL IMPRESSION / ASSESSMENT AND PLAN / ED COURSE  As part of my medical decision making, I reviewed the following data within the electronic MEDICAL RECORD NUMBER    Cellulitis secondary to burn right leg.  Area is again reclean, Silvadene applied, and re-bandage.  Patient given discharge care instructions.  Due to the increased erythema patient also given prescription for Bactrim DS.  Advised to follow-up as needed.      ____________________________________________   FINAL CLINICAL IMPRESSION(S) / ED DIAGNOSES  Final diagnoses:  Encounter for wound re-check     ED Discharge Orders        Ordered    sulfamethoxazole-trimethoprim (BACTRIM DS,SEPTRA DS) 800-160 MG tablet  2 times daily     04/12/18 1405       Note:  This document was prepared using Dragon voice recognition software and may include unintentional dictation errors.    Joni Reining, PA-C 04/12/18 1414    Sharman Cheek, MD 04/12/18  [phone]

## 2018-04-12 NOTE — ED Notes (Signed)
Burn area cleansed with saline  Silvadene dressing applied well

## 2018-04-12 NOTE — ED Triage Notes (Signed)
Here for recheck of burn to leg  Lower leg is swollen and redness is spreading   Also having increased pain

## 2018-07-22 ENCOUNTER — Encounter: Payer: Self-pay | Admitting: Emergency Medicine

## 2018-07-22 ENCOUNTER — Emergency Department
Admission: EM | Admit: 2018-07-22 | Discharge: 2018-07-22 | Disposition: A | Discharge status: Home / Self Care | Payer: Self-pay | Attending: Emergency Medicine | Admitting: Emergency Medicine

## 2018-07-22 ENCOUNTER — Emergency Department: Payer: Self-pay

## 2018-07-22 ENCOUNTER — Other Ambulatory Visit: Payer: Self-pay

## 2018-07-22 DIAGNOSIS — O039 Complete or unspecified spontaneous abortion without complication: Secondary | ICD-10-CM | POA: Insufficient documentation

## 2018-07-22 DIAGNOSIS — Z3A01 Less than 8 weeks gestation of pregnancy: Secondary | ICD-10-CM | POA: Insufficient documentation

## 2018-07-22 DIAGNOSIS — F17210 Nicotine dependence, cigarettes, uncomplicated: Secondary | ICD-10-CM | POA: Insufficient documentation

## 2018-07-22 DIAGNOSIS — Z79899 Other long term (current) drug therapy: Secondary | ICD-10-CM | POA: Insufficient documentation

## 2018-07-22 DIAGNOSIS — R58 Hemorrhage, not elsewhere classified: Secondary | ICD-10-CM

## 2018-07-22 HISTORY — DX: Unspecified pre-eclampsia, unspecified trimester: O14.90

## 2018-07-22 LAB — COMPREHENSIVE METABOLIC PANEL
ALBUMIN: 3.4 g/dL — AB (ref 3.5–5.0)
ALT: 10 U/L (ref 0–44)
ANION GAP: 8 (ref 5–15)
AST: 13 U/L — ABNORMAL LOW (ref 15–41)
Alkaline Phosphatase: 36 U/L — ABNORMAL LOW (ref 38–126)
BUN: 7 mg/dL (ref 6–20)
CALCIUM: 8.6 mg/dL — AB (ref 8.9–10.3)
CHLORIDE: 105 mmol/L (ref 98–111)
CO2: 23 mmol/L (ref 22–32)
Creatinine, Ser: 0.7 mg/dL (ref 0.44–1.00)
GFR calc non Af Amer: >60 mL/min (ref >60)
GLUCOSE: 88 mg/dL (ref 70–99)
Potassium: 3.9 mmol/L (ref 3.5–5.1)
SODIUM: 136 mmol/L (ref 135–145)
Total Bilirubin: 1.7 mg/dL — ABNORMAL HIGH (ref 0.3–1.2)
Total Protein: 5.9 g/dL — ABNORMAL LOW (ref 6.5–8.1)

## 2018-07-22 LAB — WET PREP, GENITAL
Clue Cells Wet Prep HPF POC: NONE SEEN
SPERM: NONE SEEN
TRICH WET PREP: NONE SEEN
Yeast Wet Prep HPF POC: NONE SEEN

## 2018-07-22 LAB — URINALYSIS, COMPLETE (UACMP) WITH MICROSCOPIC
Specific Gravity, Urine: 1.005 (ref 1.005–1.030)
Squamous Epithelial / LPF: NONE SEEN (ref 0–5)

## 2018-07-22 LAB — CBC
HEMATOCRIT: 35 % (ref 35.0–47.0)
HEMOGLOBIN: 12.4 g/dL (ref 12.0–16.0)
MCH: 33.3 pg (ref 26.0–34.0)
MCHC: 35.3 g/dL (ref 32.0–36.0)
MCV: 94.3 fL (ref 80.0–100.0)
Platelets: 180 10*3/uL (ref 150–440)
RBC: 3.71 MIL/uL — AB (ref 3.80–5.20)
RDW: 13.3 % (ref 11.5–14.5)
WBC: 10.4 10*3/uL (ref 3.6–11.0)

## 2018-07-22 LAB — HCG, QUANTITATIVE, PREGNANCY: hCG, Beta Chain, Quant, S: 15002 m[IU]/mL — ABNORMAL HIGH (ref <5)

## 2018-07-22 LAB — PREGNANCY, URINE: Preg Test, Ur: POSITIVE — AB

## 2018-07-22 MED ORDER — MORPHINE SULFATE (PF) 4 MG/ML IV SOLN
INTRAVENOUS | Status: AC
  Administered 2018-07-22: 4 mg
  Filled 2018-07-22: qty 1

## 2018-07-22 MED ORDER — MORPHINE SULFATE (PF) 4 MG/ML IV SOLN
4.0000 mg | Freq: Once | INTRAVENOUS | Status: AC
  Administered 2018-07-22: 4 mg via INTRAVENOUS
  Filled 2018-07-22: qty 1

## 2018-07-22 MED ORDER — MORPHINE SULFATE (PF) 4 MG/ML IV SOLN: 4.0000 mg | Freq: Once | INTRAVENOUS | Status: DC

## 2018-07-22 MED ORDER — ONDANSETRON HCL 4 MG/2ML IJ SOLN
4.0000 mg | Freq: Once | INTRAMUSCULAR | Status: AC
Start: 2018-07-22 — End: 2018-07-22
  Administered 2018-07-22: 4 mg via INTRAVENOUS
  Filled 2018-07-22: qty 2

## 2018-07-22 NOTE — ED Notes (Signed)
Pt returned from US

## 2018-07-22 NOTE — ED Triage Notes (Signed)
Pt reports being in the middle of miscarrying. Pt states her last menstrual period was at the end of June. Reports she was at the gas station x 3 days ago and experiencing a gush of blood; then bent over in house yesterday and gushed blood at that time. Today passed 5-6 blood clots. Thought she would pass it without issue but is concerned with the amount of blood she has lost.  Fatigue. Headache. C/o "tiredness in legs." Nausea. Cold sweats.

## 2018-07-22 NOTE — ED Provider Notes (Addendum)
[facility] Emergency Department Provider Note   ____________________________________________   First MD Initiated Contact with Patient 07/22/18 Rickey Primus     (approximate)  I have reviewed the triage vital signs and the nursing notes.   HISTORY  Chief Complaint Vaginal Bleeding   HPI Bethany Ramsey is a 27 y.o. female patient reports her last menstrual period was in June.  She has had intermittent cramping and bleeding for the last 3 days.  Today it got much worse.  I saw her initially she went to ultrasound came back and just before I saw her again she passed a large amount of tissue.  Cramping and bleeding subsided dramatically.  Repeat ultrasound showed that she had passed the pregnancy with fetal demise that she had had in her uterus.   Past Medical History:  Diagnosis Date  . Preeclampsia    when pregnant with son 2013    There are no active problems to display for this patient.   History reviewed. No pertinent surgical history.  Prior to Admission medications   Medication Sig Start Date End Date Taking? Authorizing Provider  HYDROcodone-acetaminophen (NORCO) 5-325 MG tablet Take 1 tablet by mouth every 6 (six) hours as needed for severe pain. 04/14/16   Hagler, Jami L, PA-C  ibuprofen (ADVIL,MOTRIN) 600 MG tablet Take 1 tablet (600 mg total) by mouth every 6 (six) hours as needed. 04/14/16   Hagler, Jami L, PA-C  ibuprofen (ADVIL,MOTRIN) 600 MG tablet Take 1 tablet (600 mg total) by mouth every 8 (eight) hours as needed. 04/11/18   Joni Reining, PA-C  lisinopril-hydrochlorothiazide (PRINZIDE,ZESTORETIC) 10-12.5 MG tablet Take 1 tablet by mouth daily. 03/11/16   Jene Every, MD  naproxen (NAPROSYN) 500 MG tablet Take 1 tablet (500 mg total) by mouth 2 (two) times daily with a meal. 03/11/16   Jene Every, MD  oxyCODONE-acetaminophen (PERCOCET) 5-325 MG tablet Take 1 tablet by mouth every 6 (six) hours as needed for severe pain. 04/12/18 04/12/19  Joni Reining, PA-C  oxyCODONE-acetaminophen (PERCOCET/ROXICET) 5-325 MG per tablet Take 1 tablet by mouth every 4 (four) hours as needed for pain.    [provider]  silver sulfADIAZINE (SILVADENE) 1 % cream Apply to affected area daily 04/11/18 04/11/19  Joni Reining, PA-C  sulfamethoxazole-trimethoprim (BACTRIM DS) 800-160 MG per tablet Take 1 tablet by mouth 2 (two) times daily.    [provider]  sulfamethoxazole-trimethoprim (BACTRIM DS,SEPTRA DS) 800-160 MG tablet Take 1 tablet by mouth 2 (two) times daily. 04/12/18   Joni Reining, PA-C  traMADol (ULTRAM) 50 MG tablet Take 1 tablet (50 mg total) by mouth every 12 (twelve) hours as needed. 04/11/18   Joni Reining, PA-C    Allergies Patient has no known allergies.  No family history on file.  Social History Social History   Tobacco Use  . Smoking status: Current Every Day Smoker    Packs/day: 1.00    Types: Cigarettes  . Smokeless tobacco: Never Used  Substance Use Topics  . Alcohol use: No  . Drug use: Yes    Types: Marijuana    Review of Systems  Constitutional: No fever/chills Eyes: No visual changes. ENT: No sore throat. Cardiovascular: Denies chest pain. Respiratory: Denies shortness of breath. Gastrointestinal see HPI Genitourinary: Negative for dysuria. Musculoskeletal: Negative for back pain. Skin: Negative for rash. Neurological: Negative for headaches, focal weakness   ____________________________________________   PHYSICAL EXAM:  VITAL SIGNS: ED Triage Vitals  Enc Vitals Group  BP 07/22/18 1724 118/73     Pulse Rate 07/22/18 1724 97     Resp 07/22/18 1724 14     Temp 07/22/18 1724 98 F (36.7 C)     Temp Source 07/22/18 1724 Oral     SpO2 07/22/18 1724 100 %     Weight 07/22/18 1725 205 lb (93 kg)     Height 07/22/18 1725 5\' 9"  (1.753 m)     Head Circumference --      Peak Flow --      Pain Score 07/22/18 1724 0     Pain Loc --      Pain Edu? --      Excl. in GC? --      Constitutional: Alert and oriented.  In acute distress.  Crying with the cramps she is having Eyes: Conjunctivae are normal.  Head: Atraumatic. Nose: No congestion/rhinnorhea. Mouth/Throat: Mucous membranes are moist.  Oropharynx non-erythematous. Neck: No stridor.  Cardiovascular: Normal rate, regular rhythm. Grossly normal heart sounds.  Good peripheral circulation. Respiratory: Normal respiratory effort.  No retractions. Lungs CTAB. Gastrointestinal: Soft but has some lower abdominal pain and tenderness no distention. No abdominal bruits. No CVA tenderness. Genitourinary: When I saw her again after she passed the tissue her pain was much better.  She had scant blood in the vagina.  The os felt to be fingertip.  There was no cervical motion tenderness or masses. Musculoskeletal: No lower extremity tenderness nor edema.   Neurologic:  Normal speech and language. No gross focal neurologic deficits are appreciated.  Skin:  Skin is warm, dry and intact. No rash noted. Psychiatric: Mood and affect are normal. Speech and behavior are normal.  ____________________________________________   LABS (all labs ordered are listed, but only abnormal results are displayed)  Labs Reviewed  HCG, QUANTITATIVE, PREGNANCY - Abnormal; Notable for the following components:      Result Value   hCG, Beta Chain, Quant, S 15,002 (*)    All other components within normal limits  CBC - Abnormal; Notable for the following components:   RBC 3.71 (*)    All other components within normal limits  URINALYSIS, COMPLETE (UACMP) WITH MICROSCOPIC - Abnormal; Notable for the following components:   Color, Urine RED (*)    APPearance CLOUDY (*)    Glucose, UA   (*)    Value: TEST NOT REPORTED DUE TO COLOR INTERFERENCE OF URINE PIGMENT   Hgb urine dipstick   (*)    Value: TEST NOT REPORTED DUE TO COLOR INTERFERENCE OF URINE PIGMENT   Bilirubin Urine   (*)    Value: TEST NOT REPORTED DUE TO COLOR INTERFERENCE OF  URINE PIGMENT   Ketones, ur   (*)    Value: TEST NOT REPORTED DUE TO COLOR INTERFERENCE OF URINE PIGMENT   Protein, ur   (*)    Value: TEST NOT REPORTED DUE TO COLOR INTERFERENCE OF URINE PIGMENT   Nitrite   (*)    Value: TEST NOT REPORTED DUE TO COLOR INTERFERENCE OF URINE PIGMENT   Leukocytes, UA   (*)    Value: TEST NOT REPORTED DUE TO COLOR INTERFERENCE OF URINE PIGMENT   RBC / HPF >50 (*)    Bacteria, UA FEW (*)    All other components within normal limits  COMPREHENSIVE METABOLIC PANEL - Abnormal; Notable for the following components:   Calcium 8.6 (*)    Total Protein 5.9 (*)    Albumin 3.4 (*)    AST 13 (*)  Alkaline Phosphatase 36 (*)    Total Bilirubin 1.7 (*)    All other components within normal limits  PREGNANCY, URINE - Abnormal; Notable for the following components:   Preg Test, Ur POSITIVE (*)    All other components within normal limits  WET PREP, GENITAL  CHLAMYDIA/NGC RT PCR ([facility] ONLY)  SURGICAL PATHOLOGY   ____________________________________________  EKG  ____________________________________________  RADIOLOGY  ED MD interpretation: Initial ultrasound report is intrauterine fetal demise second ultrasound showed no retained products  Official radiology report(s): US Ob Comp Less 14 Wks  Result Date: 07/22/2018 CLINICAL DATA:  Pelvic cramping and vaginal bleeding for the past 3 days. Unknown last menstrual period. Pregnant with a quantitative beta HCG of 15,002 EXAM: OBSTETRIC <14 WK ULTRASOUND TECHNIQUE: Transabdominal ultrasound was performed for evaluation of the gestation as well as the maternal uterus and adnexal regions. COMPARISON:  None. FINDINGS: Intrauterine gestational sac: Visualized Yolk sac:  Not visualized Embryo:  Visualized Cardiac Activity: Not visualized CRL:   14.7 mm   7 w 6 d                  [place] EDC: 03/04/2019 Subchorionic hemorrhage:  None visualized. Maternal uterus/adnexae: Normal appearing ovaries. No free peritoneal  fluid. IMPRESSION: Single intrauterine gestation with findings compatible with fetal demise at 7 weeks and 6 days of gestation. Critical Value/emergent results were called by telephone at the time of interpretation on 07/22/2018 at 7:59 pm to Dr. Dorothea Glassman , who verbally acknowledged these results. Electronically Signed   By: Beckie Salts M.D.   On: 07/22/2018 19:59    ____________________________________________   PROCEDURES  Procedure(s) performed:   Procedures  Critical Care performed:   ____________________________________________   INITIAL IMPRESSION / ASSESSMENT AND PLAN / ED COURSE I have patient follow-up with Dr. Elesa Massed.  Otherwise she appears to be fine to go.         ____________________________________________   FINAL CLINICAL IMPRESSION(S) / ED DIAGNOSES  Final diagnoses:  Miscarriage     ED Discharge Orders    None       Note:  This document was prepared using Dragon voice recognition software and may include unintentional dictation errors.    Arnaldo Natal, MD 07/22/18 2159 Care everywhere documents patient's blood type is A positive done at [facility] both on 03/02/2012 and 09/13/2011.   Arnaldo Natal, MD 07/22/18 2210 Preliminary result of pathology shows possible partial mole.  Final pathology is pending.  I have asked the charge nurse to let Sheran Luz know about this so that we can call the patient and make sure she follows up with OB/GYN.   Arnaldo Natal, MD 07/28/18 [phone] ----------------------------------------- 9:37 PM on 08/09/2018 -----------------------------------------  Sheran Luz has called the patient but she did not answer her voicemail is full.  She texted her as well but patient has not answered yet she has not followed up with Dr. Elesa Massed.  I have called Dr. Elesa Massed and let her know about the patient and texted her the patient's contact information that is her name and her medical record number via secure messaging.  She  will attempt to contact the patient as well in the meantime we will also try to contact the patient again trying to call her text her and also send her a certified letter.  Patient does have a partial mole this is very important that we follow-up.  I have asked Sheran Luz to include in the letter a sentence that says this  could fell up into a cancer.   Arnaldo Natal, MD 08/09/18 2138

## 2018-07-22 NOTE — ED Notes (Signed)
Patient transported to Ultrasound 

## 2018-07-22 NOTE — Discharge Instructions (Signed)
These return for worse pain fever vomiting.  Please give Dr. Elesa Massed to call and follow-up with her in the next week or so.

## 2018-07-22 NOTE — ED Notes (Signed)
Pt attempting to obtain urine specimen at this time.

## 2018-07-23 ENCOUNTER — Telehealth: Payer: Self-pay | Admitting: Emergency Medicine

## 2018-07-23 LAB — CHLAMYDIA/NGC RT PCR (ARMC ONLY)
CHLAMYDIA TR: DETECTED — AB
N GONORRHOEAE: NOT DETECTED

## 2018-07-23 NOTE — Telephone Encounter (Signed)
Called patient to inform of positive chlamydia test and need for treatment.  No answer and voicemial is full.  Sent sms message with my number.

## 2018-08-08 ENCOUNTER — Encounter: Payer: Self-pay | Admitting: Emergency Medicine

## 2018-08-08 LAB — SURGICAL PATHOLOGY

## 2018-08-11 ENCOUNTER — Telehealth: Payer: Self-pay | Admitting: Emergency Medicine

## 2018-08-11 NOTE — Telephone Encounter (Signed)
Contacted patient, informed her that Dr.MaLinda needs her to see either Chelsey Ward in follow up or a OB GYN for a partial Mole that is concerning for cancer.  Pt verbalized understanding.

## 2018-08-13 ENCOUNTER — Telehealth: Payer: Self-pay | Admitting: Obstetrics & Gynecology

## 2018-08-13 NOTE — Telephone Encounter (Signed)
Patient diagnsosed with both -chlamydia -partial molar pregnancy  She needs chlamydia treatment (and partners) AND close follow up of her beta HCGs as well as pregnancy prevention during the next year.  So far we have: -called several times, I was able to leave a message on the 26th -emailed -sent letter  Will call mother if unable to reach her within the week.

## 2018-08-27 ENCOUNTER — Telehealth: Payer: Self-pay | Admitting: Obstetrics & Gynecology

## 2018-08-27 NOTE — Telephone Encounter (Signed)
Called patient again.  Was able to leave voicemail this time [phone]  Left message saying to call my office, gave my name and number twice.    Said it involved infectious diseases and cancer, and I didn't want to have to leave a message about these, and to please call me as soon as possible.   ----- Ranae Plumber, MD Attending Obstetrician and Gynecologist [facility], Department of OB/GYN [facility]

## 2018-10-15 ENCOUNTER — Telehealth: Payer: Self-pay | Admitting: Obstetrics & Gynecology

## 2018-10-15 NOTE — Telephone Encounter (Signed)
Called patient = she has not called my office or the hospital after leaving two messages regarding her partial molar pregnancy.  Left another voice message, saying the pathology of the specimen we received at the hospital when she was here was abnormal and that it could involve cancer, and to please call my office at [phone] if possible.  ----- Ranae Plumberhelsea Ward, MD Attending Obstetrician and Gynecologist [facility], Department of OB[facility]GYN [facility]

## 2018-11-14 NOTE — L&D Delivery Note (Signed)
       Delivery Note   Bethany Ramsey is a 28 y.o. [id number] at [redacted]w[redacted]d Estimated Date of Delivery: 08/20/19  PRE-OPERATIVE DIAGNOSIS:  1) [redacted]w[redacted]d pregnancy.   POST-OPERATIVE DIAGNOSIS:  1) [redacted]w[redacted]d pregnancy s/p Vaginal, Spontaneous   Delivery Type: Vaginal, Spontaneous    Delivery Anesthesia: Epidural   Labor Complications:  None    ESTIMATED BLOOD LOSS: 450 ml    FINDINGS:   1) female infant, Apgar scores of 9   at 1 minute and 10   at 5 minutes and a birthweight of 121.34  ounces.    2) Nuchal cord: no  SPECIMENS:   PLACENTA:   Appearance: Intact , 3 vessel cord   Removal: Spontaneous      Disposition:   held per protocol then discarded DISPOSITION:  Infant to left in stable condition in the delivery room, with L&D personnel and mother,  NARRATIVE SUMMARY: Labor course:  Ms. [name] is a [id number] at [redacted]w[redacted]d who presented for induction of labor.  She progressed well in labor with pitocin.  She received the appropriate epidural anesthesia and proceeded to complete dilation. She evidenced good maternal expulsive effort during the second stage. She went on to deliver a viable infant. The placenta delivered without problems and was noted to be complete. A perineal and vaginal examination was performed. Lacerations: 2nd degree , llaceration was repaired with 3-0 Vicryl Rapide suture. The patient tolerated this well. 800 mcg cyctotec placed rectally.   Philip Aspen, CNM  08/20/2019 4:41 PM

## 2019-03-12 ENCOUNTER — Telehealth: Payer: Self-pay

## 2019-03-12 NOTE — Telephone Encounter (Signed)
Coronavirus (COVID-19) Are you at risk?  Are you at risk for the Coronavirus (COVID-19)?  To be considered HIGH RISK for Coronavirus (COVID-19), you have to meet the following criteria:  . Traveled to [place], [place], [place], [place] or [place]; or in the [place] to [place], [place], [place], or [place]; and have fever, cough, and shortness of breath within the last 2 weeks of travel OR . Been in close contact with a person diagnosed with COVID-19 within the last 2 weeks and have fever, cough, and shortness of breath . IF YOU DO NOT MEET THESE CRITERIA, YOU ARE CONSIDERED LOW RISK FOR COVID-19.  What to do if you are HIGH RISK for COVID-19?  . If you are having a medical emergency, call 911. . Seek medical care right away. Before you go to a doctor's office, urgent care or emergency department, call ahead and tell them about your recent travel, contact with someone diagnosed with COVID-19, and your symptoms. You should receive instructions from your physician's office regarding next steps of care.  . When you arrive at healthcare provider, tell the healthcare staff immediately you have returned from visiting [place], [place], [place], [place] or [place]; or traveled in the [place] to [place], [place], [place], or [place]; in the last two weeks or you have been in close contact with a person diagnosed with COVID-19 in the last 2 weeks.   . Tell the health care staff about your symptoms: fever, cough and shortness of breath. . After you have been seen by a medical provider, you will be either: o Tested for (COVID-19) and discharged home on quarantine except to seek medical care if symptoms worsen, and asked to  - Stay home and avoid contact with others until you get your results (4-5 days)  - Avoid travel on public transportation if possible (such as bus, train, or airplane) or o Sent to the Emergency Department by EMS for evaluation, COVID-19 testing, and possible  admission depending on your condition and test results.  What to do if you are LOW RISK for COVID-19?  Reduce your risk of any infection by using the same precautions used for avoiding the common cold or flu:  . Wash your hands often with soap and warm water for at least 20 seconds.  If soap and water are not readily available, use an alcohol-based hand sanitizer with at least 60% alcohol.  . If coughing or sneezing, cover your mouth and nose by coughing or sneezing into the elbow areas of your shirt or coat, into a tissue or into your sleeve (not your hands). . Avoid shaking hands with others and consider head nods or verbal greetings only. . Avoid touching your eyes, nose, or mouth with unwashed hands.  . Avoid close contact with people who are sick. . Avoid places or events with large numbers of people in one location, like concerts or sporting events. . Carefully consider travel plans you have or are making. . If you are planning any travel outside or inside the [place], visit the CDC's Travelers' Health webpage for the latest health notices. . If you have some symptoms but not all symptoms, continue to monitor at home and seek medical attention if your symptoms worsen. . If you are having a medical emergency, call 911.   ADDITIONAL HEALTHCARE OPTIONS FOR PATIENTS  [facility] / e-Visit: [email or web address]         [facility]: [phone]  [place]   [facility]: [phone]                   [facility]: [phone]   Pre-screen negative, DM.   

## 2019-03-13 ENCOUNTER — Other Ambulatory Visit (HOSPITAL_COMMUNITY)
Admission: RE | Admit: 2019-03-13 | Discharge: 2019-03-13 | Disposition: A | Discharge status: Home / Self Care | Payer: Medicaid Other | Source: Ambulatory Visit | Attending: Certified Nurse Midwife | Admitting: Certified Nurse Midwife

## 2019-03-13 ENCOUNTER — Other Ambulatory Visit: Payer: Self-pay

## 2019-03-13 ENCOUNTER — Encounter: Payer: Self-pay | Admitting: Certified Nurse Midwife

## 2019-03-13 ENCOUNTER — Ambulatory Visit: Payer: Self-pay | Admitting: Certified Nurse Midwife

## 2019-03-13 VITALS — BP 109/71 | HR 84 | Wt 222.3 lb

## 2019-03-13 DIAGNOSIS — Z3401 Encounter for supervision of normal first pregnancy, first trimester: Secondary | ICD-10-CM

## 2019-03-13 MED ORDER — ASPIRIN 81 MG PO TABS: 81.0000 mg | ORAL_TABLET | Freq: Every day | ORAL | 6 refills | Status: DC

## 2019-03-13 NOTE — Patient Instructions (Signed)

## 2019-03-13 NOTE — Progress Notes (Signed)
NEW OB HISTORY AND PHYSICAL  SUBJECTIVE:       Bethany Ramsey is a 28 y.o. G1P0 female, Patient's last menstrual period was 11/06/2018 (exact date)., Estimated Date of Delivery: None noted., Unknown, presents today for establishment of Prenatal Care.  She has no unusual complaints. She has history of miscarriage with partial hydatidiform mole. Hx hypertension in pregnancy. Not sure if gestational or pregnancy induced hypertension.   Body mass index is 32.83 kg/m.    Gynecologic History Patient's last menstrual period was 11/06/2018 (exact date). Normal Contraception: none Last Pap: a few yrs ago. Results were: normal  Obstetric History OB History  Gravida Para Term Preterm AB Living  4 1 1   2 1   SAB TAB Ectopic Multiple Live Births  1       1    # Outcome Date GA Lbr Len/2nd Weight Sex Delivery Anes PTL Lv  4 Current           3 SAB 2019          2 AB 2018          1 Term 2013   7 lb (3.175 kg) M Vag-Spont  N LIV    Past Medical History:  Diagnosis Date  . Preeclampsia    when pregnant with son 2013    History reviewed. No pertinent surgical history.  Current Outpatient Medications on File Prior to Visit  Medication Sig Dispense Refill  . Prenatal Vit-Fe Fumarate-FA (PRENATAL MULTIVITAMIN) TABS tablet Take 1 tablet by mouth daily at 12 noon.     No current facility-administered medications on file prior to visit.     No Known Allergies  Social History   Socioeconomic History  . Marital status: Single    Spouse name: Not on file  . Number of children: Not on file  . Years of education: Not on file  . Highest education level: Not on file  Occupational History  . Not on file  Social Needs  . Financial resource strain: Not on file  . Food insecurity:    Worry: Not on file    Inability: Not on file  . Transportation needs:    Medical: Not on file    Non-medical: Not on file  Tobacco Use  . Smoking status: Current Every Day Smoker    Packs/day: 1.00   Types: Cigarettes  . Smokeless tobacco: Never Used  Substance and Sexual Activity  . Alcohol use: No  . Drug use: Yes    Types: Marijuana  . Sexual activity: Yes    Birth control/protection: Condom  Lifestyle  . Physical activity:    Days per week: Not on file    Minutes per session: Not on file  . Stress: Not on file  Relationships  . Social connections:    Talks on phone: Not on file    Gets together: Not on file    Attends religious service: Not on file    Active member of club or organization: Not on file    Attends meetings of clubs or organizations: Not on file    Relationship status: Not on file  . Intimate partner violence:    Fear of current or ex partner: Not on file    Emotionally abused: Not on file    Physically abused: Not on file    Forced sexual activity: Not on file  Other Topics Concern  . Not on file  Social History Narrative  . Not on file    History  reviewed. No pertinent family history.  The following portions of the patient's history were reviewed and updated as appropriate: allergies, current medications, past OB history, past medical history, past surgical history, past family history, past social history, and problem list.    OBJECTIVE: Initial Physical Exam (New OB)  GENERAL APPEARANCE: alert, well appearing, in no apparent distress, oriented to person, place and time, overweight, in mild to moderate distress HEAD: normocephalic, atraumatic MOUTH: mucous membranes moist, pharynx normal without lesions THYROID: no thyromegaly or masses present BREASTS: no masses noted, no significant tenderness, no palpable axillary nodes, no skin changes LUNGS: clear to auscultation, no wheezes, rales or rhonchi, symmetric air entry HEART: regular rate and rhythm, no murmurs ABDOMEN: soft, nontender, nondistended, no abnormal masses, no epigastric pain, obese, fundus soft, nontender 18-20 weeks size and FHT present EXTREMITIES: no redness or tenderness in  the calves or thighs, no edema, no limitation in range of motion, intact peripheral pulses SKIN: normal coloration and turgor, no rashes LYMPH NODES: no adenopathy palpable NEUROLOGIC: alert, oriented, normal speech, no focal findings or movement disorder noted  PELVIC EXAM EXTERNAL GENITALIA: normal appearing vulva with no masses, tenderness or lesions VAGINA: no abnormal discharge or lesions CERVIX: no lesions or cervical motion tenderness UTERUS: gravid ADNEXA: no masses palpable and nontender OB EXAM PELVIMETRY: appears adequate RECTUM: exam not indicated  ASSESSMENT: Normal pregnancy  PLAN: New OB counseling: The patient has been given an overview regarding routine prenatal care. Midwifery or MD care. Pt request to be midwife pt. Recommendations regarding diet, weight gain, and exercise in pregnancy were given. Prenatal testing, optional genetic testing,carrier screening, and ultrasound use in pregnancy were reviewed. Pt has history of taking pills (percocet). She was going to [facility]. 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 Pt states she now has medicaid so she will be getting established at a clinic.  Benefits of Breast Feeding were discussed. The patient is encouraged to consider nursing her baby post partum.  Doreene Burke, CNM

## 2019-03-14 ENCOUNTER — Other Ambulatory Visit: Payer: Self-pay

## 2019-03-14 DIAGNOSIS — Z3401 Encounter for supervision of normal first pregnancy, first trimester: Secondary | ICD-10-CM

## 2019-03-14 LAB — CBC WITH DIFFERENTIAL
Basophils Absolute: 0 10*3/uL (ref 0.0–0.2)
Basos: 0 %
EOS (ABSOLUTE): 0.2 10*3/uL (ref 0.0–0.4)
Eos: 3 %
Hematocrit: 38.4 % (ref 34.0–46.6)
Hemoglobin: 12.7 g/dL (ref 11.1–15.9)
Immature Grans (Abs): 0 10*3/uL (ref 0.0–0.1)
Immature Granulocytes: 0 %
Lymphocytes Absolute: 1.3 10*3/uL (ref 0.7–3.1)
Lymphs: 18 %
MCH: 30.3 pg (ref 26.6–33.0)
MCHC: 33.1 g/dL (ref 31.5–35.7)
MCV: 92 fL (ref 79–97)
Monocytes Absolute: 0.4 10*3/uL (ref 0.1–0.9)
Monocytes: 6 %
Neutrophils Absolute: 5.4 10*3/uL (ref 1.4–7.0)
Neutrophils: 73 %
RBC: 4.19 x10E6/uL (ref 3.77–5.28)
RDW: 12.8 % (ref 11.7–15.4)
WBC: 7.4 10*3/uL (ref 3.4–10.8)

## 2019-03-14 LAB — MICROSCOPIC EXAMINATION
Casts: NONE SEEN /lpf
Epithelial Cells (non renal): >10 /hpf — AB (ref 0–10)

## 2019-03-14 LAB — VARICELLA ZOSTER ANTIBODY, IGG: Varicella zoster IgG: 861 index (ref >165)

## 2019-03-14 LAB — RPR: RPR Ser Ql: NONREACTIVE

## 2019-03-14 LAB — URINALYSIS, ROUTINE W REFLEX MICROSCOPIC
Bilirubin, UA: NEGATIVE
Glucose, UA: NEGATIVE
Nitrite, UA: NEGATIVE
Specific Gravity, UA: >=1.03 — AB (ref 1.005–1.030)
Urobilinogen, Ur: 0.2 mg/dL (ref 0.2–1.0)
pH, UA: 5.5 (ref 5.0–7.5)

## 2019-03-14 LAB — HIV ANTIBODY (ROUTINE TESTING W REFLEX): HIV Screen 4th Generation wRfx: NONREACTIVE

## 2019-03-14 LAB — HEMOGLOBIN A1C
Est. average glucose Bld gHb Est-mCnc: 103 mg/dL
Hgb A1c MFr Bld: 5.2 % (ref 4.8–5.6)

## 2019-03-14 LAB — ANTIBODY SCREEN: Antibody Screen: NEGATIVE

## 2019-03-14 LAB — ABO AND RH: Rh Factor: POSITIVE

## 2019-03-14 LAB — HEPATITIS B SURFACE ANTIGEN: Hepatitis B Surface Ag: NEGATIVE

## 2019-03-14 LAB — RUBELLA SCREEN: Rubella Antibodies, IGG: 1.19 index (ref >0.99)

## 2019-03-14 LAB — TSH: TSH: 1.4 u[IU]/mL (ref 0.450–4.500)

## 2019-03-14 LAB — HGB SOLU + RFLX FRAC: Sickle Solubility Test - HGBRFX: NEGATIVE

## 2019-03-15 LAB — URINE CULTURE

## 2019-03-15 LAB — GLUCOSE TOLERANCE, 1 HOUR: Glucose, 1Hr PP: 50 mg/dL — ABNORMAL LOW (ref 65–199)

## 2019-03-16 LAB — OPIATES CONFIRMATION, URINE
Codeine: NEGATIVE
HYDROCODONE CONFIRM: 124 ng/mL
Hydrocodone: POSITIVE — AB
Hydromorphone Confirm: 167 ng/mL
Hydromorphone: POSITIVE — AB
Morphine: NEGATIVE
Opiates: POSITIVE ng/mL — AB

## 2019-03-16 LAB — MONITOR DRUG PROFILE 14(MW)
Amphetamine Scrn, Ur: NEGATIVE ng/mL
BARBITURATE SCREEN URINE: NEGATIVE ng/mL
BENZODIAZEPINE SCREEN, URINE: NEGATIVE ng/mL
Creatinine(Crt), U: 365.2 mg/dL — ABNORMAL HIGH (ref 20.0–300.0)
Fentanyl, Urine: NEGATIVE pg/mL
Meperidine Screen, Urine: NEGATIVE ng/mL
Methadone Screen, Urine: NEGATIVE ng/mL
OXYCODONE+OXYMORPHONE UR QL SCN: NEGATIVE ng/mL
Ph of Urine: 5.9 (ref 4.5–8.9)
Phencyclidine Qn, Ur: NEGATIVE ng/mL
Propoxyphene Scrn, Ur: NEGATIVE ng/mL
SPECIFIC GRAVITY: 1.024
Tramadol Screen, Urine: NEGATIVE ng/mL

## 2019-03-16 LAB — COCAINE (GC/MS), URINE
BENZOYLECGONINE (GC/MS): 14800 ng/mL
COCAINE + METABOLITE: POSITIVE — AB

## 2019-03-16 LAB — BUPRENORPHINE CONFIRM, URINE
Buprenorphine Confirm: 133 ng/mL
Buprenorphine: POSITIVE — AB
Buprenorphine: POSITIVE — AB
Norbuprenorphine Confirm: 1019 ng/mL
Norbuprenorphine: POSITIVE — AB

## 2019-03-16 LAB — CANNABINOID (GC/MS), URINE
Cannabinoid: POSITIVE — AB
Carboxy THC (GC/MS): 286 ng/mL

## 2019-03-16 LAB — NICOTINE SCREEN, URINE: Cotinine Ql Scrn, Ur: POSITIVE ng/mL — AB

## 2019-03-18 LAB — GC/CHLAMYDIA PROBE AMP
Chlamydia trachomatis, NAA: POSITIVE — AB
Neisseria Gonorrhoeae by PCR: NEGATIVE

## 2019-03-19 ENCOUNTER — Other Ambulatory Visit: Payer: Self-pay | Admitting: Certified Nurse Midwife

## 2019-03-19 MED ORDER — AZITHROMYCIN 500 MG PO TABS: 1000.0000 mg | ORAL_TABLET | Freq: Once | ORAL | 1 refills | Status: AC

## 2019-03-19 NOTE — Progress Notes (Signed)
Chlamydia positive. Treatment ordered. PT notified by nurse.   Doreene Burke, CNM

## 2019-03-20 ENCOUNTER — Telehealth: Payer: Self-pay

## 2019-03-20 NOTE — Telephone Encounter (Signed)
Informed patient of ATs orders regarding positive test results. Patient expressed understanding. Neonatal Abstinence Syndrome and Smoking and Your Baby pamphlets mailed to patient per ATs request.

## 2019-03-21 LAB — CYTOLOGY - PAP
Diagnosis: UNDETERMINED — AB
HPV: NOT DETECTED

## 2019-03-27 ENCOUNTER — Other Ambulatory Visit: Payer: Self-pay

## 2019-03-27 ENCOUNTER — Ambulatory Visit (INDEPENDENT_AMBULATORY_CARE_PROVIDER_SITE_OTHER): Payer: Medicaid Other | Admitting: Obstetrics and Gynecology

## 2019-03-27 ENCOUNTER — Ambulatory Visit (INDEPENDENT_AMBULATORY_CARE_PROVIDER_SITE_OTHER): Payer: Medicaid Other

## 2019-03-27 ENCOUNTER — Encounter: Payer: Self-pay | Admitting: Obstetrics and Gynecology

## 2019-03-27 VITALS — BP 112/70 | HR 8 | Wt 230.2 lb

## 2019-03-27 DIAGNOSIS — Z0283 Encounter for blood-alcohol and blood-drug test: Secondary | ICD-10-CM

## 2019-03-27 DIAGNOSIS — Z3401 Encounter for supervision of normal first pregnancy, first trimester: Secondary | ICD-10-CM | POA: Diagnosis not present

## 2019-03-27 DIAGNOSIS — Z3492 Encounter for supervision of normal pregnancy, unspecified, second trimester: Secondary | ICD-10-CM

## 2019-03-27 NOTE — Progress Notes (Signed)
ROB and anatomy scan; will change EDC, see anatomy scan below: repeated urine drug screen. Will retest for CMZ at next visit as she just took antibiotics 3 days ago. Denies any symptoms. Is home FT with 27yo son.  Indications:Anatomy Ultrasound Findings:  Singleton intrauterine pregnancy is visualized with FHR at 155 BPM. Biometrics give an (U/S) Gestational age of [redacted]w[redacted]d and an (U/S) EDD of 08/20/2019; this does not correlates with the clinically established Estimated Date of Delivery: 08/13/19  Fetal presentation is Breech.  EFW: 276 g ( 10 oz ). Placenta: anterior. Grade: 1 AFI: subjectively normal.  Anatomic survey is complete and normal; Gender - female.    Right Ovary is normal in appearance. Left Ovary is normal appearance. Survey of the adnexa demonstrates no adnexal masses. There is no free peritoneal fluid in the cul de sac.  Impression: 1. [redacted]w[redacted]d Viable Singleton Intrauterine pregnancy by U/S. 2. (U/S) EDD is not consistent with Clinically established Estimated Date of Delivery: 08/13/19 . [place] EDD is 08/20/2019. 3. Normal Anatomy Scan

## 2019-03-27 NOTE — Addendum Note (Signed)
Addended by: Rosine Beat L on: 03/27/2019 03:39 PM   Modules accepted: Orders

## 2019-03-27 NOTE — Progress Notes (Signed)
ROB- had anatomy scan done today, pt is doing well

## 2019-04-01 LAB — BUPRENORPHINE CONFIRM, URINE
Buprenorphine Confirm: 748 ng/mL
Buprenorphine: POSITIVE — AB
Buprenorphine: POSITIVE — AB
Norbuprenorphine Confirm: 1428 ng/mL
Norbuprenorphine: POSITIVE — AB

## 2019-04-01 LAB — CANNABINOID (GC/MS), URINE
Cannabinoid: POSITIVE — AB
Carboxy THC (GC/MS): 374 ng/mL

## 2019-04-01 LAB — MONITOR DRUG PROFILE 14(MW)
Amphetamine Scrn, Ur: NEGATIVE ng/mL
BARBITURATE SCREEN URINE: NEGATIVE ng/mL
BENZODIAZEPINE SCREEN, URINE: NEGATIVE ng/mL
Cocaine (Metab) Scrn, Ur: NEGATIVE ng/mL
Creatinine(Crt), U: 238.6 mg/dL (ref 20.0–300.0)
Fentanyl, Urine: NEGATIVE pg/mL
Meperidine Screen, Urine: NEGATIVE ng/mL
Methadone Screen, Urine: NEGATIVE ng/mL
OXYCODONE+OXYMORPHONE UR QL SCN: NEGATIVE ng/mL
Opiate Scrn, Ur: NEGATIVE ng/mL
Ph of Urine: 7 (ref 4.5–8.9)
Phencyclidine Qn, Ur: NEGATIVE ng/mL
Propoxyphene Scrn, Ur: NEGATIVE ng/mL
SPECIFIC GRAVITY: 1.026
Tramadol Screen, Urine: NEGATIVE ng/mL

## 2019-04-26 ENCOUNTER — Telehealth: Payer: Self-pay

## 2019-04-26 NOTE — Telephone Encounter (Signed)
Coronavirus (COVID-19) Are you at risk?  Are you at risk for the Coronavirus (COVID-19)?  To be considered HIGH RISK for Coronavirus (COVID-19), you have to meet the following criteria:  . Traveled to [place], [place], [place], [place] or [place]; or in the [place] to [place], [place], [place], or [place]; and have fever, cough, and shortness of breath within the last 2 weeks of travel OR . Been in close contact with a person diagnosed with COVID-19 within the last 2 weeks and have fever, cough, and shortness of breath . IF YOU DO NOT MEET THESE CRITERIA, YOU ARE CONSIDERED LOW RISK FOR COVID-19.  What to do if you are HIGH RISK for COVID-19?  . If you are having a medical emergency, call 911. . Seek medical care right away. Before you go to a doctor's office, urgent care or emergency department, call ahead and tell them about your recent travel, contact with someone diagnosed with COVID-19, and your symptoms. You should receive instructions from your physician's office regarding next steps of care.  . When you arrive at healthcare provider, tell the healthcare staff immediately you have returned from visiting [place], [place], [place], [place] or [place]; or traveled in the [place] to [place], [place], [place], or [place]; in the last two weeks or you have been in close contact with a person diagnosed with COVID-19 in the last 2 weeks.   . Tell the health care staff about your symptoms: fever, cough and shortness of breath. . After you have been seen by a medical provider, you will be either: o Tested for (COVID-19) and discharged home on quarantine except to seek medical care if symptoms worsen, and asked to  - Stay home and avoid contact with others until you get your results (4-5 days)  - Avoid travel on public transportation if possible (such as bus, train, or airplane) or o Sent to the Emergency Department by EMS for evaluation, COVID-19 testing, and possible  admission depending on your condition and test results.  What to do if you are LOW RISK for COVID-19?  Reduce your risk of any infection by using the same precautions used for avoiding the common cold or flu:  . Wash your hands often with soap and warm water for at least 20 seconds.  If soap and water are not readily available, use an alcohol-based hand sanitizer with at least 60% alcohol.  . If coughing or sneezing, cover your mouth and nose by coughing or sneezing into the elbow areas of your shirt or coat, into a tissue or into your sleeve (not your hands). . Avoid shaking hands with others and consider head nods or verbal greetings only. . Avoid touching your eyes, nose, or mouth with unwashed hands.  . Avoid close contact with people who are sick. . Avoid places or events with large numbers of people in one location, like concerts or sporting events. . Carefully consider travel plans you have or are making. . If you are planning any travel outside or inside the [place], visit the CDC's Travelers' Health webpage for the latest health notices. . If you have some symptoms but not all symptoms, continue to monitor at home and seek medical attention if your symptoms worsen. . If you are having a medical emergency, call 911.   ADDITIONAL HEALTHCARE OPTIONS FOR PATIENTS  [facility] / e-Visit: [email or web address]         [facility]: [phone]  [facility]: [phone]                   [facility]: [phone]   Pre-screen negative, DM.   

## 2019-04-29 ENCOUNTER — Ambulatory Visit (INDEPENDENT_AMBULATORY_CARE_PROVIDER_SITE_OTHER): Payer: Medicaid Other | Admitting: Certified Nurse Midwife

## 2019-04-29 ENCOUNTER — Other Ambulatory Visit: Payer: Self-pay

## 2019-04-29 VITALS — BP 117/73 | HR 95 | Wt 235.4 lb

## 2019-04-29 DIAGNOSIS — Z130 Encounter for screening for diseases of the blood and blood-forming organs and certain disorders involving the immune mechanism: Secondary | ICD-10-CM

## 2019-04-29 DIAGNOSIS — Z3492 Encounter for supervision of normal pregnancy, unspecified, second trimester: Secondary | ICD-10-CM

## 2019-04-29 DIAGNOSIS — Z113 Encounter for screening for infections with a predominantly sexual mode of transmission: Secondary | ICD-10-CM

## 2019-04-29 DIAGNOSIS — O98312 Other infections with a predominantly sexual mode of transmission complicating pregnancy, second trimester: Secondary | ICD-10-CM

## 2019-04-29 DIAGNOSIS — A749 Chlamydial infection, unspecified: Secondary | ICD-10-CM

## 2019-04-29 DIAGNOSIS — F1290 Cannabis use, unspecified, uncomplicated: Secondary | ICD-10-CM | POA: Insufficient documentation

## 2019-04-29 DIAGNOSIS — Z0283 Encounter for blood-alcohol and blood-drug test: Secondary | ICD-10-CM

## 2019-04-29 DIAGNOSIS — Z131 Encounter for screening for diabetes mellitus: Secondary | ICD-10-CM

## 2019-04-29 LAB — POCT URINALYSIS DIPSTICK OB
Bilirubin, UA: NEGATIVE
Blood, UA: NEGATIVE
Glucose, UA: NEGATIVE
Ketones, UA: NEGATIVE
Leukocytes, UA: NEGATIVE
Nitrite, UA: NEGATIVE
Spec Grav, UA: 1.015 (ref 1.010–1.025)
Urobilinogen, UA: 0.2 E.U./dL
pH, UA: 7.5 (ref 5.0–8.0)

## 2019-04-29 NOTE — Patient Instructions (Signed)

## 2019-04-29 NOTE — Progress Notes (Signed)
ROB-Patient c/o intermittent pelvic pressure x2 weeks and bilateral leg "fatigue" x2 weeks.

## 2019-04-29 NOTE — Progress Notes (Signed)
ROB-Reports intermittent pelvic pressure and leg fatigue. Discussed home treatment measures including use of abdominal support. Repeat UDS and TOC Ch today, see orders. Anticipatory guidance regarding course of prenatal care. Reviewed red flag symptoms and when to call. RTC x 4 weeks for 28 wk labs, TDaP/BTC, and ROB or sooner if needed.

## 2019-05-03 LAB — GC/CHLAMYDIA PROBE AMP
Chlamydia trachomatis, NAA: NEGATIVE
Neisseria Gonorrhoeae by PCR: NEGATIVE

## 2019-05-05 LAB — MONITOR DRUG PROFILE 14(MW)
Amphetamine Scrn, Ur: NEGATIVE ng/mL
BARBITURATE SCREEN URINE: NEGATIVE ng/mL
BENZODIAZEPINE SCREEN, URINE: NEGATIVE ng/mL
Cocaine (Metab) Scrn, Ur: NEGATIVE ng/mL
Creatinine(Crt), U: 139.2 mg/dL (ref 20.0–300.0)
Fentanyl, Urine: NEGATIVE pg/mL
Meperidine Screen, Urine: NEGATIVE ng/mL
Methadone Screen, Urine: NEGATIVE ng/mL
OXYCODONE+OXYMORPHONE UR QL SCN: NEGATIVE ng/mL
Opiate Scrn, Ur: NEGATIVE ng/mL
Ph of Urine: 7.3 (ref 4.5–8.9)
Phencyclidine Qn, Ur: NEGATIVE ng/mL
Propoxyphene Scrn, Ur: NEGATIVE ng/mL
SPECIFIC GRAVITY: 1.028
Tramadol Screen, Urine: NEGATIVE ng/mL

## 2019-05-05 LAB — NICOTINE SCREEN, URINE: Cotinine Ql Scrn, Ur: POSITIVE ng/mL — AB

## 2019-05-05 LAB — BUPRENORPHINE CONFIRM, URINE
Buprenorphine Confirm: 250 ng/mL
Buprenorphine: POSITIVE — AB
Buprenorphine: POSITIVE — AB
Norbuprenorphine Confirm: 1206 ng/mL
Norbuprenorphine: POSITIVE — AB

## 2019-05-05 LAB — CANNABINOID (GC/MS), URINE
Cannabinoid: POSITIVE — AB
Carboxy THC (GC/MS): 286 ng/mL

## 2019-05-06 ENCOUNTER — Encounter: Payer: Self-pay | Admitting: Certified Nurse Midwife

## 2019-05-06 DIAGNOSIS — F1120 Opioid dependence, uncomplicated: Secondary | ICD-10-CM | POA: Insufficient documentation

## 2019-05-06 DIAGNOSIS — F1911 Other psychoactive substance abuse, in remission: Secondary | ICD-10-CM | POA: Insufficient documentation

## 2019-05-06 DIAGNOSIS — F17200 Nicotine dependence, unspecified, uncomplicated: Secondary | ICD-10-CM | POA: Insufficient documentation

## 2019-05-27 ENCOUNTER — Ambulatory Visit (INDEPENDENT_AMBULATORY_CARE_PROVIDER_SITE_OTHER): Payer: Medicaid Other | Admitting: Certified Nurse Midwife

## 2019-05-27 ENCOUNTER — Encounter: Payer: Self-pay | Admitting: Certified Nurse Midwife

## 2019-05-27 ENCOUNTER — Other Ambulatory Visit: Payer: Medicaid Other

## 2019-05-27 ENCOUNTER — Other Ambulatory Visit: Payer: Self-pay

## 2019-05-27 VITALS — BP 102/70 | HR 84 | Wt 236.4 lb

## 2019-05-27 DIAGNOSIS — Z131 Encounter for screening for diabetes mellitus: Secondary | ICD-10-CM

## 2019-05-27 DIAGNOSIS — Z113 Encounter for screening for infections with a predominantly sexual mode of transmission: Secondary | ICD-10-CM

## 2019-05-27 DIAGNOSIS — Z23 Encounter for immunization: Secondary | ICD-10-CM

## 2019-05-27 DIAGNOSIS — Z3492 Encounter for supervision of normal pregnancy, unspecified, second trimester: Secondary | ICD-10-CM

## 2019-05-27 DIAGNOSIS — Z130 Encounter for screening for diseases of the blood and blood-forming organs and certain disorders involving the immune mechanism: Secondary | ICD-10-CM

## 2019-05-27 LAB — POCT URINALYSIS DIPSTICK OB
Bilirubin, UA: NEGATIVE
Blood, UA: NEGATIVE
Glucose, UA: NEGATIVE
Ketones, UA: NEGATIVE
Leukocytes, UA: NEGATIVE
Nitrite, UA: NEGATIVE
POC,PROTEIN,UA: NEGATIVE
Spec Grav, UA: 1.025 (ref 1.010–1.025)
Urobilinogen, UA: 0.2 E.U./dL
pH, UA: 6.5 (ref 5.0–8.0)

## 2019-05-27 MED ORDER — TETANUS-DIPHTH-ACELL PERTUSSIS 5-2.5-18.5 LF-MCG/0.5 IM SUSP
0.5000 mL | Freq: Once | INTRAMUSCULAR | Status: AC
  Administered 2019-05-27: 11:00:00 0.5 mL via INTRAMUSCULAR

## 2019-05-27 NOTE — Progress Notes (Signed)
ROB doing well. Feels good movement. Tdap/BTC/RPR/CBC/glucose screen today. Discussed bc after baby. Pt wants PP tubal. Consent reviewed and signed. Sample birth plan given. Follow up 2 wks.   Philip Aspen, CNM

## 2019-05-27 NOTE — Patient Instructions (Signed)
Td Vaccine (Tetanus and Diphtheria): What You Need to Know 1. Why get vaccinated? Tetanus  and diphtheria are very serious diseases. They are rare in the [place] today, but people who do become infected often have severe complications. Td vaccine is used to protect adolescents and adults from both of these diseases. Both tetanus and diphtheria are infections caused by bacteria. Diphtheria spreads from person to person through coughing or sneezing. Tetanus-causing bacteria enter the body through cuts, scratches, or wounds. TETANUS (Lockjaw) causes painful muscle tightening and stiffness, usually all over the body.  It can lead to tightening of muscles in the head and neck so you can't open your mouth, swallow, or sometimes even breathe. Tetanus kills about 1 out of every 10 people who are infected even after receiving the best medical care. DIPHTHERIA can cause a thick coating to form in the back of the throat.  It can lead to breathing problems, paralysis, heart failure, and death. Before vaccines, as many as 200,000 cases of diphtheria and hundreds of cases of tetanus were reported in the [place] each year. Since vaccination began, reports of cases for both diseases have dropped by about 99%. 2. Td vaccine Td vaccine can protect adolescents and adults from tetanus and diphtheria. Td is usually given as a booster dose every 10 years but it can also be given earlier after a severe and dirty wound or burn. Another vaccine, called Tdap, which protects against pertussis in addition to tetanus and diphtheria, is sometimes recommended instead of Td vaccine. Your doctor or the person giving you the vaccine can give you more information. Td may safely be given at the same time as other vaccines. 3. Some people should not get this vaccine  A person who has ever had a life-threatening allergic reaction after a previous dose of any tetanus or diphtheria containing vaccine, OR has a severe allergy  to any part of this vaccine, should not get Td vaccine. Tell the person giving the vaccine about any severe allergies.  Talk to your doctor if you: ? had severe pain or swelling after any vaccine containing diphtheria or tetanus, ? ever had a condition called Guillain Barr Syndrome (GBS), ? aren't feeling well on the day the shot is scheduled. 4. Risks of a vaccine reaction With any medicine, including vaccines, there is a chance of side effects. These are usually mild and go away on their own. Serious reactions are also possible but are rare. Most people who get Td vaccine do not have any problems with it. Mild Problems following Td vaccine: (Did not interfere with activities)  Pain where the shot was given (about 8 people in 10)  Redness or swelling where the shot was given (about 1 person in 4)  Mild fever (rare)  Headache (about 1 person in 4)  Tiredness (about 1 person in 4) Moderate Problems following Td vaccine: (Interfered with activities, but did not require medical attention)  Fever over 102F (rare) Severe Problems following Td vaccine: (Unable to perform usual activities; required medical attention)  Swelling, severe pain, bleeding and/or redness in the arm where the shot was given (rare). Problems that could happen after any vaccine:  People sometimes faint after a medical procedure, including vaccination. Sitting or lying down for about 15 minutes can help prevent fainting, and injuries caused by a fall. Tell your doctor if you feel dizzy, or have vision changes or ringing in the ears.  Some people get severe pain in the shoulder and have   difficulty moving the arm where a shot was given. This happens very rarely.  Any medication can cause a severe allergic reaction. Such reactions from a vaccine are very rare, estimated at fewer than 1 in a million doses, and would happen within a few minutes to a few hours after the vaccination. As with any medicine, there is a  very remote chance of a vaccine causing a serious injury or death. The safety of vaccines is always being monitored. For more information, visit: http://floyd.org/www.cdc.gov/vaccinesafety/ 5. What if there is a serious reaction? What should I look for?  Look for anything that concerns you, such as signs of a severe allergic reaction, very high fever, or unusual behavior. Signs of a severe allergic reaction can include hives, swelling of the face and throat, difficulty breathing, a fast heartbeat, dizziness, and weakness. These would usually start a few minutes to a few hours after the vaccination. What should I do?  If you think it is a severe allergic reaction or other emergency that can't wait, call 9-1-1 or get the person to the nearest hospital. Otherwise, call your doctor.  Afterward, the reaction should be reported to the Vaccine Adverse Event Reporting System (VAERS). Your doctor might file this report, or you can do it yourself through the VAERS web site at www.vaers.LAgents.nohhs.gov, or by calling 1-973-182-2767. VAERS does not give medical advice. 6. The National Vaccine Injury Compensation Program The [employer] Vaccine Injury Compensation Program (VICP) is a federal program that was created to compensate people who may have been injured by certain vaccines. Persons who believe they may have been injured by a vaccine can learn about the program and about filing a claim by calling 1-581-578-3869 or visiting the VICP website at SpiritualWord.atwww.hrsa.gov/vaccinecompensation. There is a time limit to file a claim for compensation. 7. How can I learn more?  Ask your doctor. He or she can give you the vaccine package insert or suggest other sources of information.  Call your local or state health department.  Contact the Centers for Disease Control and Prevention (CDC): ? Call 740-628-59471-(320) 488-9606 (1-800-CDC-INFO) ? Visit CDC's website at PicCapture.uywww.cdc.gov/vaccines Vaccine Information Statement Td Vaccine (02/23/16) This information is  not intended to replace advice given to you by your health care provider. Make sure you discuss any questions you have with your health care provider. Document Released: 08/28/2006 Document Revised: 06/18/2018 Document Reviewed: 06/18/2018 Elsevier Interactive Patient Education  2020 [employer] Contraception Choices Contraception, also called birth control, means things to use or ways to try not to get pregnant. Hormonal birth control This kind of birth control uses hormones. Here are some types of hormonal birth control:  A tube that is put under skin of the arm (implant). The tube can stay in for as long as 3 years.  Shots to get every 3 months (injections).  Pills to take every day (birth control pills).  A patch to change 1 time each week for 3 weeks (birth control patch). After that, the patch is taken off for 1 week.  A ring to put in the vagina. The ring is left in for 3 weeks. Then it is taken out of the vagina for 1 week. Then a new ring is put in.  Pills to take after unprotected sex (emergency birth control pills). Barrier birth control Here are some types of barrier birth control:  A thin covering that is put on the penis before sex (female condom). The covering is thrown away after sex.  A soft, loose covering  that is put in the vagina before sex (female condom). The covering is thrown away after sex.  A rubber bowl that sits over the cervix (diaphragm). The bowl must be made for you. The bowl is put into the vagina before sex. The bowl is left in for 6-8 hours after sex. It is taken out within 24 hours.  A small, soft cup that fits over the cervix (cervical cap). The cup must be made for you. The cup can be left in for 6-8 hours after sex. It is taken out within 48 hours.  A sponge that is put into the vagina before sex. It must be left in for at least 6 hours after sex. It must be taken out within 30 hours. Then it is thrown away.  A chemical that kills or stops sperm  from getting into the uterus (spermicide). It may be a pill, cream, jelly, or foam to put in the vagina. The chemical should be used at least 10-15 minutes before sex. IUD (intrauterine) birth control An IUD is a small, T-shaped piece of plastic. It is put inside the uterus. There are two kinds:  Hormone IUD. This kind can stay in for 3-5 years.  Copper IUD. This kind can stay in for 10 years. Permanent birth control Here are some types of permanent birth control:  Surgery to block the fallopian tubes.  Having an insert put into each fallopian tube.  Surgery to tie off the tubes that carry sperm (vasectomy). Natural planning birth control Here are some types of natural planning birth control:  Not having sex on the days the woman could get pregnant.  Using a calendar: ? To keep track of the length of each period. ? To find out what days pregnancy can happen. ? To plan to not have sex on days when pregnancy can happen.  Watching for symptoms of ovulation and not having sex during ovulation. One way the woman can check for ovulation is to check her temperature.  Waiting to have sex until after ovulation. Summary  Contraception, also called birth control, means things to use or ways to try not to get pregnant.  Hormonal methods of birth control include implants, injections, pills, patches, vaginal rings, and emergency birth control pills.  Barrier methods of birth control can include female condoms, female condoms, diaphragms, cervical caps, sponges, and spermicides.  There are two types of IUD (intrauterine device) birth control. An IUD can be put in a woman's uterus to prevent pregnancy for 3-5 years.  Permanent sterilization can be done through a procedure for males, females, or both.  Natural planning methods involve not having sex on the days when the woman could get pregnant. This information is not intended to replace advice given to you by your health care provider. Make  sure you discuss any questions you have with your health care provider. Document Released: 08/28/2009 Document Revised: 02/20/2019 Document Reviewed: 11/10/2016 Elsevier Patient Education  2020 [employer].

## 2019-05-28 LAB — CBC
Hematocrit: 31.4 % — ABNORMAL LOW (ref 34.0–46.6)
Hemoglobin: 10.7 g/dL — ABNORMAL LOW (ref 11.1–15.9)
MCH: 30.9 pg (ref 26.6–33.0)
MCHC: 34.1 g/dL (ref 31.5–35.7)
MCV: 91 fL (ref 79–97)
Platelets: 173 x10E3/uL (ref 150–450)
RBC: 3.46 x10E6/uL — ABNORMAL LOW (ref 3.77–5.28)
RDW: 11.8 % (ref 11.7–15.4)
WBC: 9.1 x10E3/uL (ref 3.4–10.8)

## 2019-05-28 LAB — RPR: RPR Ser Ql: NONREACTIVE

## 2019-05-28 LAB — GLUCOSE, 1 HOUR GESTATIONAL: Gestational Diabetes Screen: 91 mg/dL (ref 65–139)

## 2019-05-30 ENCOUNTER — Encounter: Payer: Self-pay | Admitting: Certified Nurse Midwife

## 2019-05-30 DIAGNOSIS — O99019 Anemia complicating pregnancy, unspecified trimester: Secondary | ICD-10-CM | POA: Insufficient documentation

## 2019-06-10 ENCOUNTER — Other Ambulatory Visit (HOSPITAL_COMMUNITY)
Admission: RE | Admit: 2019-06-10 | Discharge: 2019-06-10 | Disposition: A | Discharge status: Home / Self Care | Payer: Medicaid Other | Source: Ambulatory Visit | Attending: Certified Nurse Midwife | Admitting: Certified Nurse Midwife

## 2019-06-10 ENCOUNTER — Ambulatory Visit (INDEPENDENT_AMBULATORY_CARE_PROVIDER_SITE_OTHER): Payer: Medicaid Other | Admitting: Certified Nurse Midwife

## 2019-06-10 ENCOUNTER — Other Ambulatory Visit: Payer: Self-pay

## 2019-06-10 VITALS — BP 115/76 | HR 95 | Wt 240.6 lb

## 2019-06-10 DIAGNOSIS — N898 Other specified noninflammatory disorders of vagina: Secondary | ICD-10-CM

## 2019-06-10 DIAGNOSIS — R102 Pelvic and perineal pain: Secondary | ICD-10-CM | POA: Diagnosis present

## 2019-06-10 DIAGNOSIS — O26893 Other specified pregnancy related conditions, third trimester: Secondary | ICD-10-CM | POA: Insufficient documentation

## 2019-06-10 DIAGNOSIS — F1120 Opioid dependence, uncomplicated: Secondary | ICD-10-CM

## 2019-06-10 DIAGNOSIS — O99323 Drug use complicating pregnancy, third trimester: Secondary | ICD-10-CM

## 2019-06-10 DIAGNOSIS — Z3493 Encounter for supervision of normal pregnancy, unspecified, third trimester: Secondary | ICD-10-CM

## 2019-06-10 DIAGNOSIS — F1911 Other psychoactive substance abuse, in remission: Secondary | ICD-10-CM

## 2019-06-10 LAB — POCT URINALYSIS DIPSTICK OB
Bilirubin, UA: NEGATIVE
Blood, UA: NEGATIVE
Glucose, UA: NEGATIVE
Ketones, UA: NEGATIVE
Nitrite, UA: NEGATIVE
POC,PROTEIN,UA: NEGATIVE
Spec Grav, UA: 1.02 (ref 1.010–1.025)
Urobilinogen, UA: 0.2 E.U./dL
pH, UA: 7 (ref 5.0–8.0)

## 2019-06-10 NOTE — Progress Notes (Signed)
ROB-Patient c/o constant vaginal and lower back pressure x1 month.

## 2019-06-10 NOTE — Patient Instructions (Signed)
Fetal Movement Counts Patient Name: ________________________________________________ Patient Due Date: ____________________ What is a fetal movement count?  A fetal movement count is the number of times that you feel your baby move during a certain amount of time. This may also be called a fetal kick count. A fetal movement count is recommended for every pregnant woman. You may be asked to start counting fetal movements as early as week 28 of your pregnancy. Pay attention to when your baby is most active. You may notice your baby's sleep and wake cycles. You may also notice things that make your baby move more. You should do a fetal movement count:  When your baby is normally most active.  At the same time each day. A good time to count movements is while you are resting, after having something to eat and drink. How do I count fetal movements? 1. Find a quiet, comfortable area. Sit, or lie down on your side. 2. Write down the date, the start time and stop time, and the number of movements that you felt between those two times. Take this information with you to your health care visits. 3. For 2 hours, count kicks, flutters, swishes, rolls, and jabs. You should feel at least 10 movements during 2 hours. 4. You may stop counting after you have felt 10 movements. 5. If you do not feel 10 movements in 2 hours, have something to eat and drink. Then, keep resting and counting for 1 hour. If you feel at least 4 movements during that hour, you may stop counting. Contact a health care provider if:  You feel fewer than 4 movements in 2 hours.  Your baby is not moving like he or she usually does. Date: ____________ Start time: ____________ Stop time: ____________ Movements: ____________ Date: ____________ Start time: ____________ Stop time: ____________ Movements: ____________ Date: ____________ Start time: ____________ Stop time: ____________ Movements: ____________ Date: ____________ Start time:  ____________ Stop time: ____________ Movements: ____________ Date: ____________ Start time: ____________ Stop time: ____________ Movements: ____________ Date: ____________ Start time: ____________ Stop time: ____________ Movements: ____________ Date: ____________ Start time: ____________ Stop time: ____________ Movements: ____________ Date: ____________ Start time: ____________ Stop time: ____________ Movements: ____________ Date: ____________ Start time: ____________ Stop time: ____________ Movements: ____________ This information is not intended to replace advice given to you by your health care provider. Make sure you discuss any questions you have with your health care provider. Document Released: 11/30/2006 Document Revised: 11/20/2018 Document Reviewed: 12/10/2015 Elsevier Patient Education  [place]. Round Ligament Pain  The round ligament is a cord of muscle and tissue that helps support the uterus. It can become a source of pain during pregnancy if it becomes stretched or twisted as the baby grows. The pain usually begins in the second trimester (13-28 weeks) of pregnancy, and it can come and go until the baby is delivered. It is not a serious problem, and it does not cause harm to the baby. Round ligament pain is usually a short, sharp, and pinching pain, but it can also be a dull, lingering, and aching pain. The pain is felt in the lower side of the abdomen or in the groin. It usually starts deep in the groin and moves up to the outside of the hip area. The pain may occur when you:  Suddenly change position, such as quickly going from a sitting to standing position.  Roll over in bed.  Cough or sneeze.  Do physical activity. Follow these instructions at home:  Watch your condition for any changes.  When the pain starts, relax. Then try any of these methods to help with the pain: ? Sitting down. ? Flexing your knees up to your abdomen. ? Lying on your side with one  pillow under your abdomen and another pillow between your legs. ? Sitting in a warm bath for 15-20 minutes or until the pain goes away.  Take over-the-counter and prescription medicines only as told by your health care provider.  Move slowly when you sit down or stand up.  Avoid long walks if they cause pain.  Stop or reduce your physical activities if they cause pain.  Keep all follow-up visits as told by your health care provider. This is important. Contact a health care provider if:  Your pain does not go away with treatment.  You feel pain in your back that you did not have before.  Your medicine is not helping. Get help right away if:  You have a fever or chills.  You develop uterine contractions.  You have vaginal bleeding.  You have nausea or vomiting.  You have diarrhea.  You have pain when you urinate. Summary  Round ligament pain is felt in the lower abdomen or groin. It is usually a short, sharp, and pinching pain. It can also be a dull, lingering, and aching pain.  This pain usually begins in the second trimester (13-28 weeks). It occurs because the uterus is stretching with the growing baby, and it is not harmful to the baby.  You may notice the pain when you suddenly change position, when you cough or sneeze, or during physical activity.  Relaxing, flexing your knees to your abdomen, lying on one side, or taking a warm bath may help to get rid of the pain.  Get help from your health care provider if the pain does not go away or if you have vaginal bleeding, nausea, vomiting, diarrhea, or painful urination. This information is not intended to replace advice given to you by your health care provider. Make sure you discuss any questions you have with your health care provider. Document Released: 08/09/2008 Document Revised: 04/18/2018 Document Reviewed: 04/18/2018 Elsevier Patient Education  2020 Else[employer] Inc. Back Pain in Pregnancy Back pain during  pregnancy is common. Back pain may be caused by several factors that are related to changes during your pregnancy. Follow these instructions at home: Managing pain, stiffness, and swelling      If directed, for sudden (acute) back pain, put ice on the painful area. ? Put ice in a plastic bag. ? Place a towel between your skin and the bag. ? Leave the ice on for 20 minutes, 2-3 times per day.  If directed, apply heat to the affected area before you exercise. Use the heat source that your health care provider recommends, such as a moist heat pack or a heating pad. ? Place a towel between your skin and the heat source. ? Leave the heat on for 20-30 minutes. ? Remove the heat if your skin turns bright red. This is especially important if you are unable to feel pain, heat, or cold. You may have a greater risk of getting burned.  If directed, massage the affected area. Activity  Exercise as told by your health care provider. Gentle exercise is the best way to prevent or manage back pain.  Listen to your body when lifting. If lifting hurts, ask for help or bend your knees. This uses your leg muscles instead of your back  muscles.  Squat down when picking up something from the floor. Do not bend over.  Only use bed rest for short periods as told by your health care provider. Bed rest should only be used for the most severe episodes of back pain. Standing, sitting, and lying down  Do not stand in one place for long periods of time.  Use good posture when sitting. Make sure your head rests over your shoulders and is not hanging forward. Use a pillow on your lower back if necessary.  Try sleeping on your side, preferably the left side, with a pregnancy support pillow or 1-2 regular pillows between your legs. ? If you have back pain after a night's rest, your bed may be too soft. ? A firm mattress may provide more support for your back during pregnancy. General instructions  Do not wear high  heels.  Eat a healthy diet. Try to gain weight within your health care provider's recommendations.  Use a maternity girdle, elastic sling, or back brace as told by your health care provider.  Take over-the-counter and prescription medicines only as told by your health care provider.  Work with a physical therapist or massage therapist to find ways to manage back pain. Acupuncture or massage therapy may be helpful.  Keep all follow-up visits as told by your health care provider. This is important. Contact a health care provider if:  Your back pain interferes with your daily activities.  You have increasing pain in other parts of your body. Get help right away if:  You develop numbness, tingling, weakness, or problems with the use of your arms or legs.  You develop severe back pain that is not controlled with medicine.  You have a change in bowel or bladder control.  You develop shortness of breath, dizziness, or you faint.  You develop nausea, vomiting, or sweating.  You have back pain that is a rhythmic, cramping pain similar to labor pains. Labor pain is usually 1-2 minutes apart, lasts for about 1 minute, and involves a bearing down feeling or pressure in your pelvis.  You have back pain and your water breaks or you have vaginal bleeding.  You have back pain or numbness that travels down your leg.  Your back pain developed after you fell.  You develop pain on one side of your back.  You see blood in your urine.  You develop skin blisters in the area of your back pain. Summary  Back pain may be caused by several factors that are related to changes during your pregnancy.  Follow instructions as told by your health care provider for managing pain, stiffness, and swelling.  Exercise as told by your health care provider. Gentle exercise is the best way to prevent or manage back pain.  Take over-the-counter and prescription medicines only as told by your health care  provider.  Keep all follow-up visits as told by your health care provider. This is important. This information is not intended to replace advice given to you by your health care provider. Make sure you discuss any questions you have with your health care provider. Document Released: 02/08/2006 Document Revised: 02/19/2019 Document Reviewed: 04/18/2018 Elsevier Patient Education  2020 [employer]. Third Trimester of Pregnancy  The third trimester is from week 28 through week 40 (months 7 through 9). This trimester is when your unborn baby (fetus) is growing very fast. At the end of the ninth month, the unborn baby is about 20 inches in length. It weighs about  6-10 pounds. Follow these instructions at home: Medicines  Take over-the-counter and prescription medicines only as told by your doctor. Some medicines are safe and some medicines are not safe during pregnancy.  Take a prenatal vitamin that contains at least 600 micrograms (mcg) of folic acid.  If you have trouble pooping (constipation), take medicine that will make your stool soft (stool softener) if your doctor approves. Eating and drinking   Eat regular, healthy meals.  Avoid raw meat and uncooked cheese.  If you get low calcium from the food you eat, talk to your doctor about taking a daily calcium supplement.  Eat four or five small meals rather than three large meals a day.  Avoid foods that are high in fat and sugars, such as fried and sweet foods.  To prevent constipation: ? Eat foods that are high in fiber, like fresh fruits and vegetables, whole grains, and beans. ? Drink enough fluids to keep your pee (urine) clear or pale yellow. Activity  Exercise only as told by your doctor. Stop exercising if you start to have cramps.  Avoid heavy lifting, wear low heels, and sit up straight.  Do not exercise if it is too hot, too humid, or if you are in a place of great height (high altitude).  You may continue to have  sex unless your doctor tells you not to. Relieving pain and discomfort  Wear a good support bra if your breasts are tender.  Take frequent breaks and rest with your legs raised if you have leg cramps or low back pain.  Take warm water baths (sitz baths) to soothe pain or discomfort caused by hemorrhoids. Use hemorrhoid cream if your doctor approves.  If you develop puffy, bulging veins (varicose veins) in your legs: ? Wear support hose or compression stockings as told by your doctor. ? Raise (elevate) your feet for 15 minutes, 3-4 times a day. ? Limit salt in your food. Safety  Wear your seat belt when driving.  Make a list of emergency phone numbers, including numbers for family, friends, the hospital, and police and fire departments. Preparing for your baby's arrival To prepare for the arrival of your baby:  Take prenatal classes.  Practice driving to the hospital.  Visit the hospital and tour the maternity area.  Talk to your work about taking leave once the baby comes.  Pack your hospital bag.  Prepare the baby's room.  Go to your doctor visits.  Buy a rear-facing car seat. Learn how to install it in your car. General instructions  Do not use hot tubs, steam rooms, or saunas.  Do not use any products that contain nicotine or tobacco, such as cigarettes and e-cigarettes. If you need help quitting, ask your doctor.  Do not drink alcohol.  Do not douche or use tampons or scented sanitary pads.  Do not cross your legs for long periods of time.  Do not travel for long distances unless you must. Only do so if your doctor says it is okay.  Visit your dentist if you have not gone during your pregnancy. Use a soft toothbrush to brush your teeth. Be gentle when you floss.  Avoid cat litter boxes and soil used by cats. These carry germs that can cause birth defects in the baby and can cause a loss of your baby (miscarriage) or stillbirth.  Keep all your prenatal visits  as told by your doctor. This is important. Contact a doctor if:  You are not sure if  you are in labor or if your water has broken.  You are dizzy.  You have mild cramps or pressure in your lower belly.  You have a nagging pain in your belly area.  You continue to feel sick to your stomach, you throw up, or you have watery poop.  You have bad smelling fluid coming from your vagina.  You have pain when you pee. Get help right away if:  You have a fever.  You are leaking fluid from your vagina.  You are spotting or bleeding from your vagina.  You have severe belly cramps or pain.  You lose or gain weight quickly.  You have trouble catching your breath and have chest pain.  You notice sudden or extreme puffiness (swelling) of your face, hands, ankles, feet, or legs.  You have not felt the baby move in over an hour.  You have severe headaches that do not go away with medicine.  You have trouble seeing.  You are leaking, or you are having a gush of fluid, from your vagina before you are 37 weeks.  You have regular belly spasms (contractions) before you are 37 weeks. Summary  The third trimester is from week 28 through week 40 (months 7 through 9). This time is when your unborn baby is growing very fast.  Follow your doctor's advice about medicine, food, and activity.  Get ready for the arrival of your baby by taking prenatal classes, getting all the baby items ready, preparing the baby's room, and visiting your doctor to be checked.  Get help right away if you are bleeding from your vagina, or you have chest pain and trouble catching your breath, or if you have not felt your baby move in over an hour. This information is not intended to replace advice given to you by your health care provider. Make sure you discuss any questions you have with your health care provider. Document Released: 01/25/2010 Document Revised: 02/21/2019 Document Reviewed: 12/06/2016 Elsevier  Patient Education  2020 [employer]

## 2019-06-10 NOTE — Progress Notes (Signed)
ROB-Reports constant vaginal pressure for the last month. On speculum exam cervix visually closed. Vaginal swab and urine culture collected, see orders. Discussed home treatment measures including use of abdominal support. Plans epidural, breastfeeding, and circumcision. Uses [facility]. Suboxone managed by [facility] Monday, Wednesday, Friday schedule. Repeat UDS today, see orders. Third trimester handouts provided. Anticipatory guidance regarding course prenatal care including growth ultrasounds and NSTs; verbalized understanding. Reviewed red flag symptoms and when to call. RTC x 2 weeks for growth ultrasound and ROB or sooner if needed.

## 2019-06-12 LAB — CERVICOVAGINAL ANCILLARY ONLY
Bacterial vaginitis: NEGATIVE
Candida vaginitis: NEGATIVE
Trichomonas: NEGATIVE

## 2019-06-12 LAB — URINE CULTURE

## 2019-06-14 LAB — DRUG SCREEN 12+ALCOHOL+CRT, UR
Amphetamines, Urine: NEGATIVE ng/mL
BENZODIAZ UR QL: NEGATIVE ng/mL
Barbiturate: NEGATIVE ng/mL
Cannabinoids: POSITIVE — AB
Cocaine (Metabolite): NEGATIVE ng/mL
Creatinine, Urine: 241.3 mg/dL (ref 20.0–300.0)
Ethanol, Urine: NEGATIVE %
Meperidine: NEGATIVE ng/mL
Methadone: NEGATIVE ng/mL
OPIATE SCREEN URINE: NEGATIVE ng/mL
Oxycodone/Oxymorphone, Urine: NEGATIVE ng/mL
Phencyclidine: NEGATIVE ng/mL
Propoxyphene: NEGATIVE ng/mL
Tramadol: NEGATIVE ng/mL

## 2019-06-25 ENCOUNTER — Ambulatory Visit (INDEPENDENT_AMBULATORY_CARE_PROVIDER_SITE_OTHER): Payer: Medicaid Other | Admitting: Certified Nurse Midwife

## 2019-06-25 ENCOUNTER — Encounter: Payer: Self-pay | Admitting: Certified Nurse Midwife

## 2019-06-25 ENCOUNTER — Ambulatory Visit (INDEPENDENT_AMBULATORY_CARE_PROVIDER_SITE_OTHER): Payer: Medicaid Other

## 2019-06-25 ENCOUNTER — Other Ambulatory Visit: Payer: Self-pay

## 2019-06-25 VITALS — BP 113/77 | HR 84 | Wt 242.4 lb

## 2019-06-25 DIAGNOSIS — O99320 Drug use complicating pregnancy, unspecified trimester: Secondary | ICD-10-CM

## 2019-06-25 DIAGNOSIS — Z3483 Encounter for supervision of other normal pregnancy, third trimester: Secondary | ICD-10-CM

## 2019-06-25 DIAGNOSIS — F1911 Other psychoactive substance abuse, in remission: Secondary | ICD-10-CM | POA: Diagnosis not present

## 2019-06-25 DIAGNOSIS — Z3493 Encounter for supervision of normal pregnancy, unspecified, third trimester: Secondary | ICD-10-CM | POA: Diagnosis not present

## 2019-06-25 DIAGNOSIS — F1120 Opioid dependence, uncomplicated: Secondary | ICD-10-CM

## 2019-06-25 NOTE — Progress Notes (Signed)
ROB doing well. Feels good fetal movement. Complains of round ligament pain. Self help measure reviewed. She verbalizes understanding. She states she is still taking the Asprin and subutex 1 1/2 tablets daily . 8 mg tables for total of 12 mg daily. Follow up 2 wk with Melody .   Philip Aspen, CNM

## 2019-06-25 NOTE — Patient Instructions (Signed)
How a Baby Grows During Pregnancy  Pregnancy begins when a female's sperm enters a female's egg (fertilization). Fertilization usually happens in one of the tubes (fallopian tubes) that connect the ovaries to the womb (uterus). The fertilized egg moves down the fallopian tube to the uterus. Once it reaches the uterus, it implants into the lining of the uterus and begins to grow. For the first 10 weeks, the fertilized egg is called an embryo. After 10 weeks, it is called a fetus. As the fetus continues to grow, it receives oxygen and nutrients through tissue (placenta) that grows to support the developing baby. The placenta is the life support system for the baby. It provides oxygen and nutrition and removes waste. Learning as much as you can about your pregnancy and how your baby is developing can help you enjoy the experience. It can also make you aware of when there might be a problem and when to ask questions. How long does a typical pregnancy last? A pregnancy usually lasts 280 days, or about 40 weeks. Pregnancy is divided into three periods of growth, also called trimesters:  First trimester: 0-12 weeks.  Second trimester: 13-27 weeks.  Third trimester: 28-40 weeks. The day when your baby is ready to be born (full term) is your estimated date of delivery. How does my baby develop month by month? First month  The fertilized egg attaches to the inside of the uterus.  Some cells will form the placenta. Others will form the fetus.  The arms, legs, brain, spinal cord, lungs, and heart begin to develop.  At the end of the first month, the heart begins to beat. Second month  The bones, inner ear, eyelids, hands, and feet form.  The genitals develop.  By the end of 8 weeks, all major organs are developing. Third month  All of the internal organs are forming.  Teeth develop below the gums.  Bones and muscles begin to grow. The spine can flex.  The skin is transparent.  Fingernails  and toenails begin to form.  Arms and legs continue to grow longer, and hands and feet develop.  The fetus is about 3 inches (7.6 cm) long. Fourth month  The placenta is completely formed.  The external sex organs, neck, outer ear, eyebrows, eyelids, and fingernails are formed.  The fetus can hear, swallow, and move its arms and legs.  The kidneys begin to produce urine.  The skin is covered with a white, waxy coating (vernix) and very fine hair (lanugo). Fifth month  The fetus moves around more and can be felt for the first time (quickening).  The fetus starts to sleep and wake up and may begin to suck its finger.  The nails grow to the end of the fingers.  The organ in the digestive system that makes bile (gallbladder) functions and helps to digest nutrients.  If your baby is a girl, eggs are present in her ovaries. If your baby is a boy, testicles start to move down into his scrotum. Sixth month  The lungs are formed.  The eyes open. The brain continues to develop.  Your baby has fingerprints and toe prints. Your baby's hair grows thicker.  At the end of the second trimester, the fetus is about 9 inches (22.9 cm) long. Seventh month  The fetus kicks and stretches.  The eyes are developed enough to sense changes in light.  The hands can make a grasping motion.  The fetus responds to sound. Eighth month  All   organs and body systems are fully developed and functioning.  Bones harden, and taste buds develop. The fetus may hiccup.  Certain areas of the brain are still developing. The skull remains soft. Ninth month  The fetus gains about  lb (0.23 kg) each week.  The lungs are fully developed.  Patterns of sleep develop.  The fetus's head typically moves into a head-down position (vertex) in the uterus to prepare for birth.  The fetus weighs 6-9 lb (2.72-4.08 kg) and is 19-20 inches (48.26-50.8 cm) long. What can I do to have a healthy pregnancy and help  my baby develop? General instructions  Take prenatal vitamins as directed by your health care provider. These include vitamins such as folic acid, iron, calcium, and vitamin D. They are important for healthy development.  Take medicines only as directed by your health care provider. Read labels and ask a pharmacist or your health care provider whether over-the-counter medicines, supplements, and prescription drugs are safe to take during pregnancy.  Keep all follow-up visits as directed by your health care provider. This is important. Follow-up visits include prenatal care and screening tests. How do I know if my baby is developing well? At each prenatal visit, your health care provider will do several different tests to check on your health and keep track of your baby's development. These include:  Fundal height and position. ? Your health care provider will measure your growing belly from your pubic bone to the top of the uterus using a tape measure. ? Your health care provider will also feel your belly to determine your baby's position.  Heartbeat. ? An ultrasound in the first trimester can confirm pregnancy and show a heartbeat, depending on how far along you are. ? Your health care provider will check your baby's heart rate at every prenatal visit.  Second trimester ultrasound. ? This ultrasound checks your baby's development. It also may show your baby's gender. What should I do if I have concerns about my baby's development? Always talk with your health care provider about any concerns that you may have about your pregnancy and your baby. Summary  A pregnancy usually lasts 280 days, or about 40 weeks. Pregnancy is divided into three periods of growth, also called trimesters.  Your health care provider will monitor your baby's growth and development throughout your pregnancy.  Follow your health care provider's recommendations about taking prenatal vitamins and medicines during  your pregnancy.  Talk with your health care provider if you have any concerns about your pregnancy or your developing baby. This information is not intended to replace advice given to you by your health care provider. Make sure you discuss any questions you have with your health care provider. Document Released: 04/18/2008 Document Revised: 02/21/2019 Document Reviewed: 09/13/2017 Elsevier Patient Education  2020 Elsevier Inc.  

## 2019-07-05 ENCOUNTER — Other Ambulatory Visit: Payer: Self-pay

## 2019-07-05 ENCOUNTER — Observation Stay
Admission: EM | Admit: 2019-07-05 | Discharge: 2019-07-05 | Disposition: A | Discharge status: Home / Self Care | Payer: Medicaid Other | Attending: Obstetrics and Gynecology | Admitting: Obstetrics and Gynecology

## 2019-07-05 ENCOUNTER — Telehealth: Payer: Self-pay | Admitting: Obstetrics and Gynecology

## 2019-07-05 DIAGNOSIS — O26893 Other specified pregnancy related conditions, third trimester: Secondary | ICD-10-CM | POA: Diagnosis present

## 2019-07-05 DIAGNOSIS — R030 Elevated blood-pressure reading, without diagnosis of hypertension: Secondary | ICD-10-CM | POA: Diagnosis present

## 2019-07-05 DIAGNOSIS — Z3A33 33 weeks gestation of pregnancy: Secondary | ICD-10-CM | POA: Diagnosis not present

## 2019-07-05 DIAGNOSIS — O9989 Other specified diseases and conditions complicating pregnancy, childbirth and the puerperium: Secondary | ICD-10-CM | POA: Diagnosis not present

## 2019-07-05 LAB — URINALYSIS, ROUTINE W REFLEX MICROSCOPIC
Bilirubin Urine: NEGATIVE
Glucose, UA: NEGATIVE mg/dL
Hgb urine dipstick: NEGATIVE
Ketones, ur: NEGATIVE mg/dL
Leukocytes,Ua: NEGATIVE
Nitrite: NEGATIVE
Protein, ur: NEGATIVE mg/dL
Specific Gravity, Urine: 1.021 (ref 1.005–1.030)
pH: 7 (ref 5.0–8.0)

## 2019-07-05 NOTE — OB Triage Note (Signed)
Pt c/o right ankle swelling that started yesterday and elevated BP at home. Blood pressure 139/97

## 2019-07-05 NOTE — OB Triage Provider Note (Signed)
L&D OB Triage Note  Bethany Ramsey is a 28 y.o. [id number] female at [redacted]w[redacted]d, EDD Estimated Date of Delivery: 08/20/19 who presented to triage for complaints of high blood pressure readings at home with small amount of pedal edema. 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 Denies headache, visual changes or epigastric pains. has not drank any water today.   She was evaluated by the me with no significant findings/findings significant for PIH. Vital signs stable at 112-130/56-77. Mild non-pitting pedal edema bilateral. An NST was performed and has been reviewed by myself at the bedside.   NST INTERPRETATION: Indications: patient reassurance  Mode: External Baseline Rate (A): 145 bpm Variability: Moderate Accelerations: 15 x 15 Decelerations: None        Impression: reactive   Plan: NST performed was reviewed and was found to be reactive. She was discharged home with blood pressure precautions and encouraged to increased water intake and decrease stressors..  Continue routine prenatal care. Follow up with OB/GYN as previously scheduled.     Samhitha Rosen Rockney Ghee, CNM

## 2019-07-05 NOTE — Telephone Encounter (Signed)
The patient called and stated that she needs to speak with a nurse or provider in regards to the pt's blood pressure being high and also experiencing swelling. Please advise.

## 2019-07-05 NOTE — Telephone Encounter (Signed)
Called pt she has had high blood pressure reading all day 132 97, 124 92, denies headache, blurred vision, is having some swelling, advised pt if BP stays high she needs to be seen, ED she voiced understanding

## 2019-07-09 ENCOUNTER — Ambulatory Visit (INDEPENDENT_AMBULATORY_CARE_PROVIDER_SITE_OTHER): Payer: Medicaid Other | Admitting: Obstetrics and Gynecology

## 2019-07-09 ENCOUNTER — Other Ambulatory Visit: Payer: Self-pay

## 2019-07-09 VITALS — BP 122/83 | HR 78 | Wt 244.3 lb

## 2019-07-09 DIAGNOSIS — Z3493 Encounter for supervision of normal pregnancy, unspecified, third trimester: Secondary | ICD-10-CM

## 2019-07-09 LAB — POCT URINALYSIS DIPSTICK OB
Bilirubin, UA: NEGATIVE
Blood, UA: NEGATIVE
Ketones, UA: NEGATIVE
Leukocytes, UA: NEGATIVE
Nitrite, UA: NEGATIVE
Spec Grav, UA: 1.015 (ref 1.010–1.025)
Urobilinogen, UA: 0.2 E.U./dL
pH, UA: 6 (ref 5.0–8.0)

## 2019-07-09 NOTE — Progress Notes (Signed)
ROB- addresses concerns, PTL precautions discussed, encouraged increasing water intake and rest.stays at home with 28yo. Plans mother as labor support. Considering Pensions consultant.

## 2019-07-09 NOTE — Progress Notes (Signed)
ROB- pt is c/o fatigue, she is having some pelvic pressure, also c/o headaches

## 2019-07-11 ENCOUNTER — Encounter: Payer: Medicaid Other | Admitting: Obstetrics and Gynecology

## 2019-07-23 ENCOUNTER — Other Ambulatory Visit: Payer: Self-pay

## 2019-07-23 ENCOUNTER — Ambulatory Visit (INDEPENDENT_AMBULATORY_CARE_PROVIDER_SITE_OTHER): Payer: Medicaid Other | Admitting: Certified Nurse Midwife

## 2019-07-23 VITALS — BP 109/75 | HR 103 | Wt 247.9 lb

## 2019-07-23 DIAGNOSIS — F1911 Other psychoactive substance abuse, in remission: Secondary | ICD-10-CM

## 2019-07-23 DIAGNOSIS — F1120 Opioid dependence, uncomplicated: Secondary | ICD-10-CM

## 2019-07-23 DIAGNOSIS — Z3493 Encounter for supervision of normal pregnancy, unspecified, third trimester: Secondary | ICD-10-CM

## 2019-07-23 DIAGNOSIS — O99320 Drug use complicating pregnancy, unspecified trimester: Secondary | ICD-10-CM

## 2019-07-23 DIAGNOSIS — Z3685 Encounter for antenatal screening for Streptococcus B: Secondary | ICD-10-CM

## 2019-07-23 DIAGNOSIS — O99323 Drug use complicating pregnancy, third trimester: Secondary | ICD-10-CM

## 2019-07-23 DIAGNOSIS — Z113 Encounter for screening for infections with a predominantly sexual mode of transmission: Secondary | ICD-10-CM

## 2019-07-23 DIAGNOSIS — Z3A36 36 weeks gestation of pregnancy: Secondary | ICD-10-CM

## 2019-07-23 LAB — POCT URINALYSIS DIPSTICK OB
Bilirubin, UA: NEGATIVE
Blood, UA: NEGATIVE
Glucose, UA: NEGATIVE
Ketones, UA: NEGATIVE
Nitrite, UA: NEGATIVE
Spec Grav, UA: 1.01 (ref 1.010–1.025)
Urobilinogen, UA: 0.2 E.U./dL
pH, UA: 6.5 (ref 5.0–8.0)

## 2019-07-23 NOTE — Patient Instructions (Signed)

## 2019-07-23 NOTE — Progress Notes (Signed)
ROB-Reports intermittent lower back back and pelvic pain for a while. Taking Subtex daily, needs repeat UDS-unable to pee today. Received NAS brochure in the mail, no questions. 36 week cultures collected. Requests SVE. Anticipatory guidance regarding course of prenatal care. Reviewed red flag symptoms, recommended antenatal testing, signs of labor, and when to call. RTC x 1 week for growth ultrasound and ROB or sooner if needed.

## 2019-07-23 NOTE — Progress Notes (Signed)
ROB-Patient c/o intermittent lower back and pelvic pain "for a while".

## 2019-07-25 LAB — STREP GP B NAA: Strep Gp B NAA: NEGATIVE

## 2019-07-26 LAB — GC/CHLAMYDIA PROBE AMP
Chlamydia trachomatis, NAA: NEGATIVE
Neisseria Gonorrhoeae by PCR: NEGATIVE

## 2019-07-30 ENCOUNTER — Ambulatory Visit (INDEPENDENT_AMBULATORY_CARE_PROVIDER_SITE_OTHER): Payer: Medicaid Other | Admitting: Certified Nurse Midwife

## 2019-07-30 ENCOUNTER — Ambulatory Visit (INDEPENDENT_AMBULATORY_CARE_PROVIDER_SITE_OTHER): Payer: Medicaid Other

## 2019-07-30 ENCOUNTER — Other Ambulatory Visit: Payer: Self-pay

## 2019-07-30 ENCOUNTER — Encounter: Payer: Self-pay | Admitting: Certified Nurse Midwife

## 2019-07-30 VITALS — BP 108/72 | HR 75 | Wt 250.2 lb

## 2019-07-30 DIAGNOSIS — Z23 Encounter for immunization: Secondary | ICD-10-CM | POA: Diagnosis not present

## 2019-07-30 DIAGNOSIS — Z3493 Encounter for supervision of normal pregnancy, unspecified, third trimester: Secondary | ICD-10-CM

## 2019-07-30 DIAGNOSIS — O99320 Drug use complicating pregnancy, unspecified trimester: Secondary | ICD-10-CM

## 2019-07-30 DIAGNOSIS — F1911 Other psychoactive substance abuse, in remission: Secondary | ICD-10-CM

## 2019-07-30 DIAGNOSIS — F1120 Opioid dependence, uncomplicated: Secondary | ICD-10-CM | POA: Diagnosis not present

## 2019-07-30 LAB — POCT URINALYSIS DIPSTICK OB
Bilirubin, UA: NEGATIVE
Blood, UA: NEGATIVE
Glucose, UA: NEGATIVE
Ketones, UA: NEGATIVE
Leukocytes, UA: NEGATIVE
Nitrite, UA: NEGATIVE
POC,PROTEIN,UA: NEGATIVE
Spec Grav, UA: 1.015 (ref 1.010–1.025)
Urobilinogen, UA: 0.2 E.U./dL
pH, UA: 6.5 (ref 5.0–8.0)

## 2019-07-30 NOTE — Addendum Note (Signed)
Addended by: Raliegh Ip on: 07/30/2019 11:17 AM   Modules accepted: Orders

## 2019-07-30 NOTE — Progress Notes (Signed)
ROB doing well. Feels good movement. Reviewed labor precautions. Follow up 1 wk.   Bethany Ramsey, CNM  

## 2019-07-30 NOTE — Patient Instructions (Signed)
Braxton Hicks Contractions Contractions of the uterus can occur throughout pregnancy, but they are not always a sign that you are in labor. You may have practice contractions called Braxton Hicks contractions. These false labor contractions are sometimes confused with true labor. What are Braxton Hicks contractions? Braxton Hicks contractions are tightening movements that occur in the muscles of the uterus before labor. Unlike true labor contractions, these contractions do not result in opening (dilation) and thinning of the cervix. Toward the end of pregnancy (32-34 weeks), Braxton Hicks contractions can happen more often and may become stronger. These contractions are sometimes difficult to tell apart from true labor because they can be very uncomfortable. You should not feel embarrassed if you go to the hospital with false labor. Sometimes, the only way to tell if you are in true labor is for your health care provider to look for changes in the cervix. The health care provider will do a physical exam and may monitor your contractions. If you are not in true labor, the exam should show that your cervix is not dilating and your water has not broken. If there are no other health problems associated with your pregnancy, it is completely safe for you to be sent home with false labor. You may continue to have Braxton Hicks contractions until you go into true labor. How to tell the difference between true labor and false labor True labor  Contractions last 30-70 seconds.  Contractions become very regular.  Discomfort is usually felt in the top of the uterus, and it spreads to the lower abdomen and low back.  Contractions do not go away with walking.  Contractions usually become more intense and increase in frequency.  The cervix dilates and gets thinner. False labor  Contractions are usually shorter and not as strong as true labor contractions.  Contractions are usually irregular.  Contractions  are often felt in the front of the lower abdomen and in the groin.  Contractions may go away when you walk around or change positions while lying down.  Contractions get weaker and are shorter-lasting as time goes on.  The cervix usually does not dilate or become thin. Follow these instructions at home:   Take over-the-counter and prescription medicines only as told by your health care provider.  Keep up with your usual exercises and follow other instructions from your health care provider.  Eat and drink lightly if you think you are going into labor.  If Braxton Hicks contractions are making you uncomfortable: ? Change your position from lying down or resting to walking, or change from walking to resting. ? Sit and rest in a tub of warm water. ? Drink enough fluid to keep your urine pale yellow. Dehydration may cause these contractions. ? Do slow and deep breathing several times an hour.  Keep all follow-up prenatal visits as told by your health care provider. This is important. Contact a health care provider if:  You have a fever.  You have continuous pain in your abdomen. Get help right away if:  Your contractions become stronger, more regular, and closer together.  You have fluid leaking or gushing from your vagina.  You pass blood-tinged mucus (bloody show).  You have bleeding from your vagina.  You have low back pain that you never had before.  You feel your baby's head pushing down and causing pelvic pressure.  Your baby is not moving inside you as much as it used to. Summary  Contractions that occur before labor are   called Braxton Hicks contractions, false labor, or practice contractions.  Braxton Hicks contractions are usually shorter, weaker, farther apart, and less regular than true labor contractions. True labor contractions usually become progressively stronger and regular, and they become more frequent.  Manage discomfort from Braxton Hicks contractions  by changing position, resting in a warm bath, drinking plenty of water, or practicing deep breathing. This information is not intended to replace advice given to you by your health care provider. Make sure you discuss any questions you have with your health care provider. Document Released: 03/16/2017 Document Revised: 10/13/2017 Document Reviewed: 03/16/2017 Elsevier Patient Education  2020 [employer]  

## 2019-08-06 LAB — MONITOR DRUG PROFILE 14(MW)
Amphetamine Scrn, Ur: NEGATIVE ng/mL
BARBITURATE SCREEN URINE: NEGATIVE ng/mL
BENZODIAZEPINE SCREEN, URINE: NEGATIVE ng/mL
Cocaine (Metab) Scrn, Ur: NEGATIVE ng/mL
Creatinine(Crt), U: 198.1 mg/dL (ref 20.0–300.0)
Fentanyl, Urine: NEGATIVE pg/mL
Meperidine Screen, Urine: NEGATIVE ng/mL
Methadone Screen, Urine: NEGATIVE ng/mL
OXYCODONE+OXYMORPHONE UR QL SCN: NEGATIVE ng/mL
Opiate Scrn, Ur: NEGATIVE ng/mL
Ph of Urine: 6.6 (ref 4.5–8.9)
Phencyclidine Qn, Ur: NEGATIVE ng/mL
Propoxyphene Scrn, Ur: NEGATIVE ng/mL
SPECIFIC GRAVITY: 1.027
Tramadol Screen, Urine: NEGATIVE ng/mL

## 2019-08-06 LAB — BUPRENORPHINE CONFIRM, URINE
Buprenorphine Confirm: 96 ng/mL
Buprenorphine: POSITIVE — AB
Buprenorphine: POSITIVE — AB
Norbuprenorphine Confirm: 787 ng/mL
Norbuprenorphine: POSITIVE — AB

## 2019-08-06 LAB — CANNABINOID (GC/MS), URINE
Cannabinoid: POSITIVE — AB
Carboxy THC (GC/MS): >400 ng/mL

## 2019-08-08 ENCOUNTER — Other Ambulatory Visit: Payer: Self-pay

## 2019-08-08 ENCOUNTER — Encounter: Payer: Self-pay | Admitting: Certified Nurse Midwife

## 2019-08-08 ENCOUNTER — Ambulatory Visit (INDEPENDENT_AMBULATORY_CARE_PROVIDER_SITE_OTHER): Payer: Medicaid Other | Admitting: Certified Nurse Midwife

## 2019-08-08 VITALS — BP 117/69 | HR 93 | Wt 252.5 lb

## 2019-08-08 DIAGNOSIS — O99323 Drug use complicating pregnancy, third trimester: Secondary | ICD-10-CM

## 2019-08-08 DIAGNOSIS — F1911 Other psychoactive substance abuse, in remission: Secondary | ICD-10-CM

## 2019-08-08 DIAGNOSIS — Z3A38 38 weeks gestation of pregnancy: Secondary | ICD-10-CM

## 2019-08-08 DIAGNOSIS — F1120 Opioid dependence, uncomplicated: Secondary | ICD-10-CM

## 2019-08-08 DIAGNOSIS — Z3493 Encounter for supervision of normal pregnancy, unspecified, third trimester: Secondary | ICD-10-CM

## 2019-08-08 LAB — POCT URINALYSIS DIPSTICK OB
Bilirubin, UA: NEGATIVE
Blood, UA: NEGATIVE
Ketones, UA: NEGATIVE
Nitrite, UA: NEGATIVE
Spec Grav, UA: 1.02 (ref 1.010–1.025)
Urobilinogen, UA: 0.2 E.U./dL
pH, UA: 6.5 (ref 5.0–8.0)

## 2019-08-08 NOTE — Patient Instructions (Signed)

## 2019-08-08 NOTE — Progress Notes (Signed)
ROB-Reports constant back pain for the last week. Discussed home treatment measures. UDS today secondary to subutex/suboxone and marijuana use in pregnancy, see orders. Requests SVE, unchanged since last exam. Desires induction of labor, scheduled for 08/21/2019 at 0500. Reviewed red flag symptoms and when to call. RTC x 1 week for ROB or sooner if needed.

## 2019-08-08 NOTE — Progress Notes (Signed)
ROB-Patient c/o constant lower back pain x1 week.

## 2019-08-13 LAB — NICOTINE SCREEN, URINE: Cotinine Ql Scrn, Ur: POSITIVE ng/mL — AB

## 2019-08-13 LAB — MONITOR DRUG PROFILE 14(MW)
Amphetamine Scrn, Ur: NEGATIVE ng/mL
BARBITURATE SCREEN URINE: NEGATIVE ng/mL
BENZODIAZEPINE SCREEN, URINE: NEGATIVE ng/mL
Cocaine (Metab) Scrn, Ur: NEGATIVE ng/mL
Creatinine(Crt), U: 172 mg/dL (ref 20.0–300.0)
Fentanyl, Urine: NEGATIVE pg/mL
Meperidine Screen, Urine: NEGATIVE ng/mL
Methadone Screen, Urine: NEGATIVE ng/mL
OXYCODONE+OXYMORPHONE UR QL SCN: NEGATIVE ng/mL
Opiate Scrn, Ur: NEGATIVE ng/mL
Ph of Urine: 6.5 (ref 4.5–8.9)
Phencyclidine Qn, Ur: NEGATIVE ng/mL
Propoxyphene Scrn, Ur: NEGATIVE ng/mL
SPECIFIC GRAVITY: 1.023
Tramadol Screen, Urine: NEGATIVE ng/mL

## 2019-08-13 LAB — BUPRENORPHINE CONFIRM, URINE
Buprenorphine Confirm: 659 ng/mL
Buprenorphine: POSITIVE — AB
Buprenorphine: POSITIVE — AB
Norbuprenorphine Confirm: >2000 ng/mL
Norbuprenorphine: POSITIVE — AB

## 2019-08-13 LAB — CANNABINOID (GC/MS), URINE
Cannabinoid: POSITIVE — AB
Carboxy THC (GC/MS): >400 ng/mL

## 2019-08-15 ENCOUNTER — Other Ambulatory Visit: Payer: Self-pay

## 2019-08-15 ENCOUNTER — Ambulatory Visit (INDEPENDENT_AMBULATORY_CARE_PROVIDER_SITE_OTHER): Payer: Medicaid Other | Admitting: Certified Nurse Midwife

## 2019-08-15 VITALS — BP 100/59 | HR 72 | Wt 253.7 lb

## 2019-08-15 DIAGNOSIS — Z3493 Encounter for supervision of normal pregnancy, unspecified, third trimester: Secondary | ICD-10-CM

## 2019-08-15 DIAGNOSIS — Z3A39 39 weeks gestation of pregnancy: Secondary | ICD-10-CM

## 2019-08-15 LAB — POCT URINALYSIS DIPSTICK OB
Bilirubin, UA: NEGATIVE
Blood, UA: NEGATIVE
Glucose, UA: NEGATIVE
Ketones, UA: NEGATIVE
Nitrite, UA: NEGATIVE
Spec Grav, UA: 1.01 (ref 1.010–1.025)
Urobilinogen, UA: 0.2 E.U./dL
pH, UA: 7.5 (ref 5.0–8.0)

## 2019-08-15 NOTE — Patient Instructions (Signed)
Balloon Catheter Placement for Cervical Ripening Balloon catheter placement for cervical ripening is a procedure to help your cervix start to soften (ripen) and open (dilate). It is done to prepare your body for labor induction. During this procedure, a thin tube (catheter) is placed through your cervix. A tiny balloon attached to the catheter is inflated with water. Pressure from the balloon is what helps your cervix start to open. Cervical ripening with a balloon catheter can make labor induction shorter and easier. You may have this procedure if:  Your cervix is not ready for labor.  Your health care provider has planned labor induction.  You are not having twins or multiples.  Your baby is in the head-down position.  You do not have any other pregnancy complications that require you to be monitored in the hospital after balloon catheter placement. If your health care provider has recommended labor induction to stimulate a vaginal birth, this procedure may be started the day before induction. You will go home with the balloon in place and return to start induction in 12-24 hours. You may have this procedure and stay in the hospital so that your progress can be monitored as well. Tell a health care provider about:  Any allergies you have.  All medicines you are taking, including vitamins, herbs, eye drops, creams, and over-the-counter medicines.  Any blood disorders you have.  Any surgeries you have had.  Any medical conditions you have. What are the risks? Generally, this is a safe procedure. However, problems may occur, including:  Infection.  Bleeding.  Cramping or pain.  Difficulty passing urine.  The baby moving from the head-down position to a position with the feet or buttocks down (breech position). What happens before the procedure?  Your health care provider may check your baby's heartbeat (fetal monitoring) before the procedure.  You may be asked to empty your  bladder. What happens during the procedure?   You will be positioned on the exam table as if you were having a pelvic exam or Pap test.  Your health care provider may insert a medical instrument into your vagina (speculum) to see your cervix.  Your cervix may be cleaned with a germ-killing solution.  The catheter will be inserted through the opening of your cervix.  A balloon on the end of the catheter will be inflated with sterile water. Some catheters have two balloons, one on each side of the cervix.  Depending on the type of balloon catheter, the end of the catheter may be left free outside your cervix or taped to your leg. The procedure may vary among health care providers and hospitals. What can I expect after the procedure?  After the procedure, it is common to have: ? A feeling of pressure inside your vagina. ? Light vaginal bleeding (spotting).  You may have fetal monitoring before going home.  You may be sent home with the catheter in place and asked to return to start your induction in about 12-24 hours. Follow these instructions at home:  Take over-the-counter and prescription medicines only as told by your health care provider.  Return to your normal activities at home as told by your health care provider. Ask your health care provider what activities are safe for you. Do not leave home until you return for labor induction.  You may shower at home. Do not take baths, swim, or use a hot tub unless your health care provider approves.  As your cervix opens, your catheter and balloon may   fall out before you return for labor induction. Ask your health care provider what you should do if this happens.  Keep all follow-up visits as told by your health care provider. This is important. You will need to return to start induction as told by your health care provider. Contact your health care provider if:  You have chills or a fever.  You have constant pain or cramps (not  contractions).  You have trouble passing urine.  Your water breaks.  You have vaginal bleeding that is heavier than spotting.  You have contractions that start to last longer and come closer together (about every 5 minutes).  The balloon catheter falls out before you return to start your induction. Summary  Cervical ripening with placement of a balloon catheter is an outpatient procedure to prepare you for labor induction.  Cervical ripening with a balloon catheter helps your cervix start to open for birth.  You will be positioned on the exam table. The catheter will be inserted through the opening of your cervix. A balloon on the end of the catheter will be inflated with water.  Pressure from the balloon will cause ripening of your cervix. You will go home with the balloon in place and return to start induction in 12-24 hours.  Contact your health care provider if you have pain, fever, vaginal bleeding, or trouble passing urine. Also contact him or her if your water breaks, you start to go into labor, or your balloon catheter falls out before you return to start your induction. This information is not intended to replace advice given to you by your health care provider. Make sure you discuss any questions you have with your health care provider. Document Released: 06/29/2018 Document Revised: 06/29/2018 Document Reviewed: 06/29/2018 Elsevier Patient Education  2020 [employer]. Fetal Movement Counts Patient Name: ________________________________________________ Patient Due Date: ____________________ What is a fetal movement count?  A fetal movement count is the number of times that you feel your baby move during a certain amount of time. This may also be called a fetal kick count. A fetal movement count is recommended for every pregnant woman. You may be asked to start counting fetal movements as early as week 28 of your pregnancy. Pay attention to when your baby is most active. You  may notice your baby's sleep and wake cycles. You may also notice things that make your baby move more. You should do a fetal movement count:  When your baby is normally most active.  At the same time each day. A good time to count movements is while you are resting, after having something to eat and drink. How do I count fetal movements? 1. Find a quiet, comfortable area. Sit, or lie down on your side. 2. Write down the date, the start time and stop time, and the number of movements that you felt between those two times. Take this information with you to your health care visits. 3. For 2 hours, count kicks, flutters, swishes, rolls, and jabs. You should feel at least 10 movements during 2 hours. 4. You may stop counting after you have felt 10 movements. 5. If you do not feel 10 movements in 2 hours, have something to eat and drink. Then, keep resting and counting for 1 hour. If you feel at least 4 movements during that hour, you may stop counting. Contact a health care provider if:  You feel fewer than 4 movements in 2 hours.  Your baby is not moving like he  or she usually does. Date: ____________ Start time: ____________ Stop time: ____________ Movements: ____________ Date: ____________ Start time: ____________ Stop time: ____________ Movements: ____________ Date: ____________ Start time: ____________ Stop time: ____________ Movements: ____________ Date: ____________ Start time: ____________ Stop time: ____________ Movements: ____________ Date: ____________ Start time: ____________ Stop time: ____________ Movements: ____________ Date: ____________ Start time: ____________ Stop time: ____________ Movements: ____________ Date: ____________ Start time: ____________ Stop time: ____________ Movements: ____________ Date: ____________ Start time: ____________ Stop time: ____________ Movements: ____________ Date: ____________ Start time: ____________ Stop time: ____________ Movements: ____________  This information is not intended to replace advice given to you by your health care provider. Make sure you discuss any questions you have with your health care provider. Document Released: 11/30/2006 Document Revised: 11/20/2018 Document Reviewed: 12/10/2015 Elsevier Patient Education  2020 [employer].

## 2019-08-15 NOTE — Progress Notes (Signed)
ROB-Patient c/o constant lower back pain x2 weeks.

## 2019-08-15 NOTE — Progress Notes (Signed)
ROB-Reports irregular contractions, request SVE. IOL scheduled from Wednesday, 08/21/2019 at 0500. Getting COVID test on Monday. Anticipatory guidance regarding IOL. Reviewed red flag symptoms, signs of labor, and when to call. RTC x Tuesday for NST, foley bulb placement and NST or sooner if needed.

## 2019-08-18 ENCOUNTER — Other Ambulatory Visit: Payer: Self-pay

## 2019-08-18 ENCOUNTER — Inpatient Hospital Stay
Admission: EM | Admit: 2019-08-18 | Discharge: 2019-08-18 | Disposition: A | Discharge status: Home / Self Care | Payer: Medicaid Other | Attending: Obstetrics and Gynecology | Admitting: Obstetrics and Gynecology

## 2019-08-18 DIAGNOSIS — Z3A39 39 weeks gestation of pregnancy: Secondary | ICD-10-CM | POA: Diagnosis not present

## 2019-08-18 DIAGNOSIS — O479 False labor, unspecified: Secondary | ICD-10-CM

## 2019-08-18 DIAGNOSIS — O471 False labor at or after 37 completed weeks of gestation: Secondary | ICD-10-CM | POA: Insufficient documentation

## 2019-08-18 LAB — RUPTURE OF MEMBRANE (ROM)PLUS: Rom Plus: NEGATIVE

## 2019-08-18 NOTE — OB Triage Provider Note (Signed)
L&D OB Triage Note  Bethany Ramsey is a 28 y.o. [id number] female at [redacted]w[redacted]d, EDD Estimated Date of Delivery: 08/20/19 who presented to triage for complaints of one gush of vaginal fluid while in shower otday.  She was evaluated by the myself with no significant findings/findings significant for ROM or signs of labor. Vital signs stable. An NST was performed and has been reviewed by me also. She was reasured.   NST INTERPRETATION: Indications: rule out uterine contractions  Mode: External Baseline Rate (A): 145 bpm Variability: Moderate Accelerations: 15 x 15 Decelerations: None     Contraction Frequency (min): none  Impression: reactive   Plan: NST performed was reviewed and was found to be reactive. She was discharged home with bleeding/labor precautions.  Continue routine prenatal care. Follow up with OB/GYN as previously scheduled.     Mont Jagoda Rockney Ghee, CNM

## 2019-08-18 NOTE — OB Triage Note (Signed)
Pt reports having big "gush" of fluid in shower this morning around 1120.  Pt reports + fetal mov't and no further leaking of fluid or vaginal bleeding.  Pt reports "tightening" in her stomach, but denies any painful regular ctx at this time.

## 2019-08-18 NOTE — OB Triage Note (Signed)
D/C instructions reviewed with patient and pt verbalized understanding of instructions. Pt has no further instructions at this tim E.

## 2019-08-19 ENCOUNTER — Other Ambulatory Visit
Admission: RE | Admit: 2019-08-19 | Discharge: 2019-08-19 | Disposition: A | Discharge status: Home / Self Care | Payer: Medicaid Other | Source: Ambulatory Visit | Attending: Certified Nurse Midwife | Admitting: Certified Nurse Midwife

## 2019-08-19 LAB — SARS CORONAVIRUS 2 (TAT 6-24 HRS): SARS Coronavirus 2: NEGATIVE

## 2019-08-20 ENCOUNTER — Other Ambulatory Visit: Payer: Medicaid Other

## 2019-08-20 ENCOUNTER — Other Ambulatory Visit: Payer: Self-pay | Admitting: Certified Nurse Midwife

## 2019-08-20 ENCOUNTER — Inpatient Hospital Stay: Payer: Medicaid Other | Admitting: Anesthesiology

## 2019-08-20 ENCOUNTER — Inpatient Hospital Stay
Admission: EM | Admit: 2019-08-20 | Discharge: 2019-08-22 | DRG: 797 | Disposition: A | Discharge status: Home / Self Care | Payer: Medicaid Other | Attending: Certified Nurse Midwife | Admitting: Certified Nurse Midwife

## 2019-08-20 ENCOUNTER — Other Ambulatory Visit: Payer: Self-pay

## 2019-08-20 ENCOUNTER — Ambulatory Visit (INDEPENDENT_AMBULATORY_CARE_PROVIDER_SITE_OTHER): Payer: Medicaid Other | Admitting: Certified Nurse Midwife

## 2019-08-20 ENCOUNTER — Encounter: Payer: Self-pay | Admitting: Certified Nurse Midwife

## 2019-08-20 DIAGNOSIS — Z87891 Personal history of nicotine dependence: Secondary | ICD-10-CM | POA: Diagnosis not present

## 2019-08-20 DIAGNOSIS — Z3483 Encounter for supervision of other normal pregnancy, third trimester: Secondary | ICD-10-CM

## 2019-08-20 DIAGNOSIS — O99320 Drug use complicating pregnancy, unspecified trimester: Secondary | ICD-10-CM

## 2019-08-20 DIAGNOSIS — Z302 Encounter for sterilization: Secondary | ICD-10-CM | POA: Diagnosis not present

## 2019-08-20 DIAGNOSIS — O26893 Other specified pregnancy related conditions, third trimester: Secondary | ICD-10-CM | POA: Diagnosis present

## 2019-08-20 DIAGNOSIS — O99214 Obesity complicating childbirth: Principal | ICD-10-CM | POA: Diagnosis present

## 2019-08-20 DIAGNOSIS — Z3A40 40 weeks gestation of pregnancy: Secondary | ICD-10-CM | POA: Diagnosis not present

## 2019-08-20 DIAGNOSIS — O99324 Drug use complicating childbirth: Secondary | ICD-10-CM | POA: Diagnosis present

## 2019-08-20 DIAGNOSIS — F1911 Other psychoactive substance abuse, in remission: Secondary | ICD-10-CM | POA: Diagnosis not present

## 2019-08-20 DIAGNOSIS — Z20828 Contact with and (suspected) exposure to other viral communicable diseases: Secondary | ICD-10-CM | POA: Diagnosis present

## 2019-08-20 DIAGNOSIS — O480 Post-term pregnancy: Secondary | ICD-10-CM

## 2019-08-20 DIAGNOSIS — O9081 Anemia of the puerperium: Secondary | ICD-10-CM | POA: Diagnosis not present

## 2019-08-20 DIAGNOSIS — F1120 Opioid dependence, uncomplicated: Secondary | ICD-10-CM

## 2019-08-20 DIAGNOSIS — F1920 Other psychoactive substance dependence, uncomplicated: Secondary | ICD-10-CM | POA: Diagnosis present

## 2019-08-20 DIAGNOSIS — F17200 Nicotine dependence, unspecified, uncomplicated: Secondary | ICD-10-CM

## 2019-08-20 LAB — CBC
HCT: 33.2 % — ABNORMAL LOW (ref 36.0–46.0)
Hemoglobin: 10.7 g/dL — ABNORMAL LOW (ref 12.0–15.0)
MCH: 28.9 pg (ref 26.0–34.0)
MCHC: 32.2 g/dL (ref 30.0–36.0)
MCV: 89.7 fL (ref 80.0–100.0)
Platelets: 174 10*3/uL (ref 150–400)
RBC: 3.7 MIL/uL — ABNORMAL LOW (ref 3.87–5.11)
RDW: 15.6 % — ABNORMAL HIGH (ref 11.5–15.5)
WBC: 8.7 10*3/uL (ref 4.0–10.5)
nRBC: 0 % (ref 0.0–0.2)

## 2019-08-20 LAB — POCT URINALYSIS DIPSTICK OB
Bilirubin, UA: NEGATIVE
Blood, UA: NEGATIVE
Glucose, UA: NEGATIVE
Ketones, UA: NEGATIVE
Leukocytes, UA: NEGATIVE
Nitrite, UA: NEGATIVE
POC,PROTEIN,UA: NEGATIVE
Spec Grav, UA: 1.02 (ref 1.010–1.025)
Urobilinogen, UA: 0.2 E.U./dL
pH, UA: 5 (ref 5.0–8.0)

## 2019-08-20 LAB — TYPE AND SCREEN
ABO/RH(D): A POS
Antibody Screen: NEGATIVE

## 2019-08-20 LAB — URINE DRUG SCREEN, QUALITATIVE (ARMC ONLY)
Amphetamines, Ur Screen: NOT DETECTED
Barbiturates, Ur Screen: NOT DETECTED
Benzodiazepine, Ur Scrn: NOT DETECTED
Cannabinoid 50 Ng, Ur ~~LOC~~: POSITIVE — AB
Cocaine Metabolite,Ur ~~LOC~~: NOT DETECTED
MDMA (Ecstasy)Ur Screen: NOT DETECTED
Methadone Scn, Ur: NOT DETECTED
Opiate, Ur Screen: NOT DETECTED
Phencyclidine (PCP) Ur S: NOT DETECTED
Tricyclic, Ur Screen: NOT DETECTED

## 2019-08-20 MED ORDER — WITCH HAZEL-GLYCERIN EX PADS: 1.0000 "application " | MEDICATED_PAD | CUTANEOUS | Status: DC | PRN

## 2019-08-20 MED ORDER — TERBUTALINE SULFATE 1 MG/ML IJ SOLN: 0.2500 mg | Freq: Once | INTRAMUSCULAR | Status: DC | PRN

## 2019-08-20 MED ORDER — LIDOCAINE-EPINEPHRINE (PF) 1.5 %-1:200000 IJ SOLN
INTRAMUSCULAR | Status: DC | PRN
  Administered 2019-08-20: 3 mL via EPIDURAL

## 2019-08-20 MED ORDER — EPHEDRINE 5 MG/ML INJ
10.0000 mg | INTRAVENOUS | Status: DC | PRN
  Filled 2019-08-20: qty 2

## 2019-08-20 MED ORDER — ACETAMINOPHEN 325 MG PO TABS
650.0000 mg | ORAL_TABLET | ORAL | Status: DC | PRN
  Administered 2019-08-20 – 2019-08-21 (×3): 650 mg via ORAL
  Filled 2019-08-20 (×3): qty 2

## 2019-08-20 MED ORDER — ONDANSETRON HCL 4 MG/2ML IJ SOLN: 4.0000 mg | Freq: Four times a day (QID) | INTRAMUSCULAR | Status: DC | PRN

## 2019-08-20 MED ORDER — OXYCODONE-ACETAMINOPHEN 5-325 MG PO TABS: 1.0000 | ORAL_TABLET | ORAL | Status: DC | PRN

## 2019-08-20 MED ORDER — MISOPROSTOL 200 MCG PO TABS
800.0000 ug | ORAL_TABLET | Freq: Once | ORAL | Status: AC
  Administered 2019-08-20: 16:00:00 800 ug via RECTAL

## 2019-08-20 MED ORDER — BUTORPHANOL TARTRATE 1 MG/ML IJ SOLN: 1.0000 mg | INTRAMUSCULAR | Status: DC | PRN

## 2019-08-20 MED ORDER — SENNOSIDES-DOCUSATE SODIUM 8.6-50 MG PO TABS: 2.0000 | ORAL_TABLET | ORAL | Status: DC

## 2019-08-20 MED ORDER — OXYTOCIN BOLUS FROM INFUSION
500.0000 mL | Freq: Once | INTRAVENOUS | Status: AC
  Administered 2019-08-20: 14:00:00 500 mL via INTRAVENOUS

## 2019-08-20 MED ORDER — DOCUSATE SODIUM 100 MG PO CAPS
100.0000 mg | ORAL_CAPSULE | Freq: Two times a day (BID) | ORAL | Status: DC
  Administered 2019-08-20 – 2019-08-22 (×4): 100 mg via ORAL
  Filled 2019-08-20 (×4): qty 1

## 2019-08-20 MED ORDER — LACTATED RINGERS IV SOLN
500.0000 mL | Freq: Once | INTRAVENOUS | Status: DC
  Administered 2019-08-20: 500 mL via INTRAVENOUS

## 2019-08-20 MED ORDER — ONDANSETRON HCL 4 MG PO TABS: 4.0000 mg | ORAL_TABLET | ORAL | Status: DC | PRN

## 2019-08-20 MED ORDER — COCONUT OIL OIL
1.0000 "application " | TOPICAL_OIL | Status: DC | PRN
  Administered 2019-08-20: 1 via TOPICAL
  Filled 2019-08-20: qty 120

## 2019-08-20 MED ORDER — PHENYLEPHRINE 40 MCG/ML (10ML) SYRINGE FOR IV PUSH (FOR BLOOD PRESSURE SUPPORT)
80.0000 ug | PREFILLED_SYRINGE | INTRAVENOUS | Status: DC | PRN
  Filled 2019-08-20: qty 10

## 2019-08-20 MED ORDER — BENZOCAINE-MENTHOL 20-0.5 % EX AERO
1.0000 "application " | INHALATION_SPRAY | CUTANEOUS | Status: DC | PRN
  Administered 2019-08-20: 1 via TOPICAL
  Filled 2019-08-20: qty 56

## 2019-08-20 MED ORDER — TETANUS-DIPHTH-ACELL PERTUSSIS 5-2.5-18.5 LF-MCG/0.5 IM SUSP
0.5000 mL | Freq: Once | INTRAMUSCULAR | Status: AC
  Filled 2019-08-20: qty 0.5

## 2019-08-20 MED ORDER — LIDOCAINE HCL (PF) 1 % IJ SOLN: 30.0000 mL | INTRAMUSCULAR | Status: DC | PRN

## 2019-08-20 MED ORDER — METHYLERGONOVINE MALEATE 0.2 MG/ML IJ SOLN: 0.2000 mg | INTRAMUSCULAR | Status: DC | PRN

## 2019-08-20 MED ORDER — OXYCODONE-ACETAMINOPHEN 5-325 MG PO TABS: 2.0000 | ORAL_TABLET | ORAL | Status: DC | PRN

## 2019-08-20 MED ORDER — FERROUS SULFATE 325 (65 FE) MG PO TABS
325.0000 mg | ORAL_TABLET | Freq: Every day | ORAL | Status: DC
  Administered 2019-08-21 – 2019-08-22 (×2): 325 mg via ORAL
  Filled 2019-08-20 (×2): qty 1

## 2019-08-20 MED ORDER — PRENATAL MULTIVITAMIN CH: 1.0000 | ORAL_TABLET | Freq: Every day | ORAL | Status: DC

## 2019-08-20 MED ORDER — LACTATED RINGERS IV SOLN
INTRAVENOUS | Status: DC
  Administered 2019-08-20 (×2): via INTRAVENOUS

## 2019-08-20 MED ORDER — DIPHENHYDRAMINE HCL 50 MG/ML IJ SOLN: 12.5000 mg | INTRAMUSCULAR | Status: DC | PRN

## 2019-08-20 MED ORDER — OXYTOCIN 40 UNITS IN NORMAL SALINE INFUSION - SIMPLE MED: 2.5000 [IU]/h | INTRAVENOUS | Status: DC

## 2019-08-20 MED ORDER — IBUPROFEN 600 MG PO TABS
600.0000 mg | ORAL_TABLET | Freq: Four times a day (QID) | ORAL | Status: DC
  Administered 2019-08-20 – 2019-08-21 (×4): 600 mg via ORAL
  Filled 2019-08-20 (×5): qty 1

## 2019-08-20 MED ORDER — BUPRENORPHINE HCL-NALOXONE HCL 8-2 MG SL SUBL
2.0000 | SUBLINGUAL_TABLET | Freq: Three times a day (TID) | SUBLINGUAL | Status: DC
  Filled 2019-08-20: qty 1
  Filled 2019-08-20: qty 2

## 2019-08-20 MED ORDER — METHYLERGONOVINE MALEATE 0.2 MG PO TABS
0.2000 mg | ORAL_TABLET | ORAL | Status: DC | PRN
  Filled 2019-08-20: qty 1

## 2019-08-20 MED ORDER — BUPIVACAINE HCL (PF) 0.25 % IJ SOLN
INTRAMUSCULAR | Status: DC | PRN
  Administered 2019-08-20 (×2): 5 mL via EPIDURAL

## 2019-08-20 MED ORDER — SIMETHICONE 80 MG PO CHEW: 80.0000 mg | CHEWABLE_TABLET | ORAL | Status: DC | PRN

## 2019-08-20 MED ORDER — ONDANSETRON HCL 4 MG/2ML IJ SOLN: 4.0000 mg | INTRAMUSCULAR | Status: DC | PRN

## 2019-08-20 MED ORDER — FENTANYL 2.5 MCG/ML W/ROPIVACAINE 0.15% IN NS 100 ML EPIDURAL (ARMC)
12.0000 mL/h | EPIDURAL | Status: DC
  Administered 2019-08-20: 14:00:00 12 mL/h via EPIDURAL
  Filled 2019-08-20: qty 100

## 2019-08-20 MED ORDER — LIDOCAINE HCL (PF) 1 % IJ SOLN
INTRAMUSCULAR | Status: DC | PRN
  Administered 2019-08-20: 3 mL via SUBCUTANEOUS

## 2019-08-20 MED ORDER — BUPRENORPHINE HCL-NALOXONE HCL 2-0.5 MG SL SUBL
2.0000 | SUBLINGUAL_TABLET | Freq: Three times a day (TID) | SUBLINGUAL | Status: DC
  Administered 2019-08-20: 19:00:00 2 via SUBLINGUAL
  Filled 2019-08-20 (×3): qty 2

## 2019-08-20 MED ORDER — DIBUCAINE (PERIANAL) 1 % EX OINT: 1.0000 "application " | TOPICAL_OINTMENT | CUTANEOUS | Status: DC | PRN

## 2019-08-20 MED ORDER — OXYTOCIN 40 UNITS IN NORMAL SALINE INFUSION - SIMPLE MED
1.0000 m[IU]/min | INTRAVENOUS | Status: DC
  Administered 2019-08-20: 4 m[IU]/min via INTRAVENOUS
  Filled 2019-08-20: qty 1000

## 2019-08-20 MED ORDER — LACTATED RINGERS IV SOLN: 500.0000 mL | INTRAVENOUS | Status: DC | PRN

## 2019-08-20 NOTE — Anesthesia Preprocedure Evaluation (Addendum)
Anesthesia Evaluation  Patient identified by MRN, date of birth, ID band Patient awake    Reviewed: Allergy & Precautions, H&P , NPO status , Patient's Chart, lab work & pertinent test results, reviewed documented beta blocker date and time   Airway Mallampati: III  TM Distance: >3 FB Neck ROM: full    Dental  (+) Teeth Intact   Pulmonary neg pulmonary ROS, former smoker,    Pulmonary exam normal        Cardiovascular Exercise Tolerance: Good hypertension, On Medications negative cardio ROS Normal cardiovascular exam Rhythm:regular Rate:Normal     Neuro/Psych negative neurological ROS  negative psych ROS   GI/Hepatic negative GI ROS, Neg liver ROS, GERD  ,  Endo/Other  negative endocrine ROS  Renal/GU negative Renal ROS  negative genitourinary   Musculoskeletal   Abdominal   Peds  Hematology negative hematology ROS (+) Blood dyscrasia, anemia ,   Anesthesia Other Findings Past Medical History: No date: Drug abuse and dependence ([place]) No date: Miscarriage No date: Obesity (BMI 30-39.9) No date: Preeclampsia     Comment:  when pregnant with son 2013 History reviewed. No pertinent surgical history. BMI    Body Mass Index: 37.66 kg/m     Reproductive/Obstetrics negative OB ROS (+) Pregnancy                            Anesthesia Physical Anesthesia Plan  ASA: III  Anesthesia Plan: Epidural   Post-op Pain Management:    Induction:   PONV Risk Score and Plan:   Airway Management Planned:   Additional Equipment:   Intra-op Plan:   Post-operative Plan:   Informed Consent: I have reviewed the patients History and Physical, chart, labs and discussed the procedure including the risks, benefits and alternatives for the proposed anesthesia with the patient or authorized representative who has indicated his/her understanding and acceptance.     Dental Advisory Given  Plan  Discussed with: Anesthesiologist and CRNA  Anesthesia Plan Comments:         Anesthesia Quick Evaluation

## 2019-08-20 NOTE — Patient Instructions (Signed)
Braxton Hicks Contractions Contractions of the uterus can occur throughout pregnancy, but they are not always a sign that you are in labor. You may have practice contractions called Braxton Hicks contractions. These false labor contractions are sometimes confused with true labor. What are Braxton Hicks contractions? Braxton Hicks contractions are tightening movements that occur in the muscles of the uterus before labor. Unlike true labor contractions, these contractions do not result in opening (dilation) and thinning of the cervix. Toward the end of pregnancy (32-34 weeks), Braxton Hicks contractions can happen more often and may become stronger. These contractions are sometimes difficult to tell apart from true labor because they can be very uncomfortable. You should not feel embarrassed if you go to the hospital with false labor. Sometimes, the only way to tell if you are in true labor is for your health care provider to look for changes in the cervix. The health care provider will do a physical exam and may monitor your contractions. If you are not in true labor, the exam should show that your cervix is not dilating and your water has not broken. If there are no other health problems associated with your pregnancy, it is completely safe for you to be sent home with false labor. You may continue to have Braxton Hicks contractions until you go into true labor. How to tell the difference between true labor and false labor True labor  Contractions last 30-70 seconds.  Contractions become very regular.  Discomfort is usually felt in the top of the uterus, and it spreads to the lower abdomen and low back.  Contractions do not go away with walking.  Contractions usually become more intense and increase in frequency.  The cervix dilates and gets thinner. False labor  Contractions are usually shorter and not as strong as true labor contractions.  Contractions are usually irregular.  Contractions  are often felt in the front of the lower abdomen and in the groin.  Contractions may go away when you walk around or change positions while lying down.  Contractions get weaker and are shorter-lasting as time goes on.  The cervix usually does not dilate or become thin. Follow these instructions at home:   Take over-the-counter and prescription medicines only as told by your health care provider.  Keep up with your usual exercises and follow other instructions from your health care provider.  Eat and drink lightly if you think you are going into labor.  If Braxton Hicks contractions are making you uncomfortable: ? Change your position from lying down or resting to walking, or change from walking to resting. ? Sit and rest in a tub of warm water. ? Drink enough fluid to keep your urine pale yellow. Dehydration may cause these contractions. ? Do slow and deep breathing several times an hour.  Keep all follow-up prenatal visits as told by your health care provider. This is important. Contact a health care provider if:  You have a fever.  You have continuous pain in your abdomen. Get help right away if:  Your contractions become stronger, more regular, and closer together.  You have fluid leaking or gushing from your vagina.  You pass blood-tinged mucus (bloody show).  You have bleeding from your vagina.  You have low back pain that you never had before.  You feel your baby's head pushing down and causing pelvic pressure.  Your baby is not moving inside you as much as it used to. Summary  Contractions that occur before labor are   called Braxton Hicks contractions, false labor, or practice contractions.  Braxton Hicks contractions are usually shorter, weaker, farther apart, and less regular than true labor contractions. True labor contractions usually become progressively stronger and regular, and they become more frequent.  Manage discomfort from Braxton Hicks contractions  by changing position, resting in a warm bath, drinking plenty of water, or practicing deep breathing. This information is not intended to replace advice given to you by your health care provider. Make sure you discuss any questions you have with your health care provider. Document Released: 03/16/2017 Document Revised: 10/13/2017 Document Reviewed: 03/16/2017 Elsevier Patient Education  2020 [employer]  

## 2019-08-20 NOTE — Addendum Note (Signed)
Addended by: Raliegh Ip on: 08/20/2019 02:26 PM   Modules accepted: Orders

## 2019-08-20 NOTE — H&P (Signed)
History and Physical   HPI  Bethany Ramsey is a 28 y.o. [id number] at [redacted]w[redacted]d Estimated Date of Delivery: 08/20/19 who is being admitted for induction of labor . History significant for subutex 12 mg daily use for history of drug abuse.     OB History  OB History  Gravida Para Term Preterm AB Living  4 1 1  0 2 1  SAB TAB Ectopic Multiple Live Births  1 0 0 0 1    # Outcome Date GA Lbr Len/2nd Weight Sex Delivery Anes PTL Lv  4 Current           3 SAB 2019          2 AB 2018          1 Term 2013   3175 g M Vag-Spont  N LIV    PROBLEM LIST  Pregnancy complications or risks: Patient Active Problem List   Diagnosis Date Noted  . Labor and delivery, indication for care 08/20/2019  . Anemia affecting pregnancy 05/30/2019  . Suboxone maintenance treatment complicating pregnancy, antepartum ([place]) 05/06/2019  . Smoker 05/06/2019  . History of substance abuse ([place]) 05/06/2019  . Marijuana user 04/29/2019    Prenatal labs and studies: ABO, Rh: --/--/PENDING (10/06 1122) Antibody: PENDING (10/06 1122) Rubella: 1.19 (04/29 1131) RPR: Non Reactive (07/13 1119)  HBsAg: Negative (04/29 1131)  HIV: Non Reactive (04/29 1131)  GBS:--/Negative (09/08 1647)   Past Medical History:  Diagnosis Date  . Drug abuse and dependence ([place])   . Miscarriage   . Obesity (BMI 30-39.9)   . Preeclampsia    when pregnant with son 2013     History reviewed. No pertinent surgical history.   Medications   Current Discharge Medication List    CONTINUE these medications which have NOT CHANGED   Details  aspirin 81 MG tablet Take 1 tablet (81 mg total) by mouth daily. Qty: 30 tablet, Refills: 6    buprenorphine (SUBUTEX) 8 MG SUBL SL tablet Place 8 mg under the tongue 2 (two) times daily.   Comments: NADEAN:    Prenatal Vit-Fe Fumarate-FA (PRENATAL MULTIVITAMIN) TABS tablet Take 1 tablet by mouth daily at 12 noon.         Allergies  Patient has no known allergies.  Review of  Systems  Constitutional: negative Eyes: negative Ears, nose, mouth, throat, and face: negative Respiratory: negative Cardiovascular: negative Gastrointestinal: negative Genitourinary:negative Integument/breast: negative Hematologic/lymphatic: negative Musculoskeletal:negative Neurological: negative Behavioral/Psych: negative Endocrine: negative Allergic/Immunologic: negative  Physical Exam  BP 137/74 (BP Location: Left Arm)   Pulse 77   Temp 98.7 F (37.1 C) (Oral)   Resp 18   Ht 5\' 9"  (1.753 m)   Wt 115.7 kg   LMP 11/06/2018 (Exact Date)   SpO2 98%   BMI 37.66 kg/m   Lungs:  CTA B Cardio: RRR without M/R/G Abd: Soft, gravid, NT Presentation: cephalic EXT: No C/C/ 1+ Edema DTRs: 2+ B CERVIX: Dilation: 5 Effacement (%): 70 Cervical Position: Posterior Station: -2 Presentation: Vertex Exam by:: Philip Aspen CNM   See Prenatal records for more detailed PE.     FHR:  Baseline: 140 bpm, Variability: Good {> 6 bpm), Accelerations: Reactive and Decelerations: Absent  Toco: Uterine Contractions: Frequency: Every 2-3 minutes, Duration: 50-80 seconds and Intensity: moderate  Test Results  Results for orders placed or performed during the hospital encounter of 08/20/19 (from the past 24 hour(s))  Type and screen     Status: None (Preliminary result)  Collection Time: 08/20/19 11:22 AM  Result Value Ref Range   ABO/RH(D) PENDING    Antibody Screen PENDING    Sample Expiration      08/23/2019,2359 Performed at [facility], [address] [place], [place] [address]   CBC     Status: Abnormal   Collection Time: 08/20/19 12:00 PM  Result Value Ref Range   WBC 8.7 4.0 - 10.5 K/uL   RBC 3.70 (L) 3.87 - 5.11 MIL/uL   Hemoglobin 10.7 (L) 12.0 - 15.0 g/dL   HCT 62.6 (L) 94.8 - 54.6 %   MCV 89.7 80.0 - 100.0 fL   MCH 28.9 26.0 - 34.0 pg   MCHC 32.2 30.0 - 36.0 g/dL   RDW 27.0 (H) 35.0 - 09.3 %   Platelets 174 150 - 400 K/uL   nRBC 0.0 0.0 - 0.2  %   Group B Strep negative  Assessment   [id number] at [redacted]w[redacted]d Estimated Date of Delivery: 08/20/19  The fetus is reassuring.   Patient Active Problem List   Diagnosis Date Noted  . Labor and delivery, indication for care 08/20/2019  . Anemia affecting pregnancy 05/30/2019  . Suboxone maintenance treatment complicating pregnancy, antepartum ([facility]) 05/06/2019  . Smoker 05/06/2019  . History of substance abuse ([facility]) 05/06/2019  . Marijuana user 04/29/2019    Plan  1. Admit to L&D :   IV Pitocin induction and AROM, clear fluid 2. EFM:-- Category 1 3. Epidural if desired.  Stadol for IV pain until epidural requested. 4. Admission labs  5. Anticipate NSVD  Doreene Burke, CNM  08/20/2019 1:22 PM

## 2019-08-20 NOTE — Progress Notes (Signed)
NST today for foley bulb placement. NST reactive Baseline 130's accelerations present, no decelerations. Occasional contraction. Foley bulb not placed due to cervical dilation of 3-4 cm/60%/-2 station.   Discussed plan of care. Pt verbalizes and agrees.   Philip Aspen, CNM

## 2019-08-20 NOTE — Anesthesia Procedure Notes (Signed)
Epidural Patient location during procedure: OB Start time: 08/20/2019 2:10 PM End time: 08/20/2019 2:18 PM  Staffing Anesthesiologist: Martha Clan, MD Resident/CRNA: Hedda Slade, CRNA Performed: resident/CRNA   Preanesthetic Checklist Completed: patient identified, site marked, surgical consent, pre-op evaluation, timeout performed, IV checked, risks and benefits discussed and monitors and equipment checked  Epidural Patient position: sitting Prep: ChloraPrep Patient monitoring: heart rate, continuous pulse ox and blood pressure Approach: midline Location: L3-L4 Injection technique: LOR saline  Needle:  Needle type: Tuohy  Needle gauge: 17 G Needle length: 9 cm and 9 Needle insertion depth: 9 cm Catheter type: closed end flexible Catheter size: 19 Gauge Catheter at skin depth: 14 cm Test dose: negative and 1.5% lidocaine with Epi 1:200 K  Assessment Events: blood not aspirated, injection not painful, no injection resistance, negative IV test and no paresthesia  Additional Notes 1 attempt Pt. Evaluated and documentation done after procedure finished. Patient identified. Risks/Benefits/Options discussed with patient including but not limited to bleeding, infection, nerve damage, paralysis, failed block, incomplete pain control, headache, blood pressure changes, nausea, vomiting, reactions to medication both or allergic, itching and postpartum back pain. Confirmed with bedside nurse the patient's most recent platelet count. Confirmed with patient that they are not currently taking any anticoagulation, have any bleeding history or any family history of bleeding disorders. Patient expressed understanding and wished to proceed. All questions were answered. Sterile technique was used throughout the entire procedure. Please see nursing notes for vital signs. Test dose was given through epidural catheter and negative prior to continuing to dose epidural or start infusion. Warning signs  of high block given to the patient including shortness of breath, tingling/numbness in hands, complete motor block, or any concerning symptoms with instructions to call for help. Patient was given instructions on fall risk and not to get out of bed. All questions and concerns addressed with instructions to call with any issues or inadequate analgesia.   Patient tolerated the insertion well without immediate complications.Reason for block:procedure for pain

## 2019-08-20 NOTE — Discharge Instructions (Signed)

## 2019-08-21 ENCOUNTER — Encounter
Admission: EM | Disposition: A | Discharge status: Home / Self Care | Payer: Self-pay | Source: Home / Self Care | Attending: Certified Nurse Midwife

## 2019-08-21 ENCOUNTER — Inpatient Hospital Stay: Payer: Medicaid Other

## 2019-08-21 DIAGNOSIS — Z302 Encounter for sterilization: Secondary | ICD-10-CM

## 2019-08-21 HISTORY — PX: TUBAL LIGATION: SHX77

## 2019-08-21 LAB — CBC
HCT: 28.6 % — ABNORMAL LOW (ref 36.0–46.0)
Hemoglobin: 9.1 g/dL — ABNORMAL LOW (ref 12.0–15.0)
MCH: 28.4 pg (ref 26.0–34.0)
MCHC: 31.8 g/dL (ref 30.0–36.0)
MCV: 89.4 fL (ref 80.0–100.0)
Platelets: 121 10*3/uL — ABNORMAL LOW (ref 150–400)
RBC: 3.2 MIL/uL — ABNORMAL LOW (ref 3.87–5.11)
RDW: 15 % (ref 11.5–15.5)
WBC: 9.1 10*3/uL (ref 4.0–10.5)
nRBC: 0 % (ref 0.0–0.2)

## 2019-08-21 LAB — RPR: RPR Ser Ql: NONREACTIVE

## 2019-08-21 SURGERY — LIGATION, FALLOPIAN TUBE, POSTPARTUM
Anesthesia: Spinal | Laterality: Bilateral

## 2019-08-21 MED ORDER — MIDAZOLAM HCL 2 MG/2ML IJ SOLN
INTRAMUSCULAR | Status: AC
  Filled 2019-08-21: qty 2

## 2019-08-21 MED ORDER — KETOROLAC TROMETHAMINE 30 MG/ML IJ SOLN
30.0000 mg | Freq: Four times a day (QID) | INTRAMUSCULAR | Status: DC
  Administered 2019-08-21: 30 mg via INTRAVENOUS
  Filled 2019-08-21: qty 1

## 2019-08-21 MED ORDER — LIDOCAINE HCL (PF) 2 % IJ SOLN
INTRAMUSCULAR | Status: AC
  Filled 2019-08-21: qty 10

## 2019-08-21 MED ORDER — MIDAZOLAM HCL 5 MG/5ML IJ SOLN
INTRAMUSCULAR | Status: DC | PRN
  Administered 2019-08-21 (×2): 2 mg via INTRAVENOUS

## 2019-08-21 MED ORDER — HYDROCODONE-ACETAMINOPHEN 5-325 MG PO TABS: 2.0000 | ORAL_TABLET | Freq: Four times a day (QID) | ORAL | Status: DC | PRN

## 2019-08-21 MED ORDER — TRAMADOL HCL 50 MG PO TABS
100.0000 mg | ORAL_TABLET | Freq: Four times a day (QID) | ORAL | Status: DC | PRN
  Administered 2019-08-21: 100 mg via ORAL
  Filled 2019-08-21: qty 2

## 2019-08-21 MED ORDER — GABAPENTIN 300 MG PO CAPS
300.0000 mg | ORAL_CAPSULE | Freq: Two times a day (BID) | ORAL | Status: DC
  Administered 2019-08-21 – 2019-08-22 (×3): 300 mg via ORAL
  Filled 2019-08-21 (×3): qty 1

## 2019-08-21 MED ORDER — BUPRENORPHINE HCL 2 MG SL SUBL
8.0000 mg | SUBLINGUAL_TABLET | Freq: Two times a day (BID) | SUBLINGUAL | Status: DC
  Administered 2019-08-21: 11:00:00 8 mg via SUBLINGUAL
  Filled 2019-08-21: qty 4

## 2019-08-21 MED ORDER — ONDANSETRON HCL 4 MG/2ML IJ SOLN: 4.0000 mg | Freq: Once | INTRAMUSCULAR | Status: DC | PRN

## 2019-08-21 MED ORDER — ACETAMINOPHEN 325 MG PO TABS
ORAL_TABLET | ORAL | Status: AC
  Administered 2019-08-21: 650 mg
  Filled 2019-08-21: qty 2

## 2019-08-21 MED ORDER — BUPIVACAINE HCL (PF) 0.5 % IJ SOLN
INTRAMUSCULAR | Status: AC
  Filled 2019-08-21: qty 30

## 2019-08-21 MED ORDER — HYDROMORPHONE HCL 2 MG PO TABS
2.0000 mg | ORAL_TABLET | Freq: Four times a day (QID) | ORAL | Status: DC | PRN
  Administered 2019-08-21: 2 mg via ORAL
  Filled 2019-08-21: qty 1

## 2019-08-21 MED ORDER — PROPOFOL 500 MG/50ML IV EMUL
INTRAVENOUS | Status: DC | PRN
  Administered 2019-08-21: 15 ug/kg/min via INTRAVENOUS

## 2019-08-21 MED ORDER — BUPIVACAINE IN DEXTROSE 0.75-8.25 % IT SOLN
INTRATHECAL | Status: DC | PRN
  Administered 2019-08-21: 2 mL via INTRATHECAL

## 2019-08-21 MED ORDER — LACTATED RINGERS IV SOLN
INTRAVENOUS | Status: DC
  Administered 2019-08-21: 13:00:00 via INTRAVENOUS

## 2019-08-21 MED ORDER — ACETAMINOPHEN 500 MG PO TABS
1000.0000 mg | ORAL_TABLET | Freq: Four times a day (QID) | ORAL | Status: DC | PRN
  Administered 2019-08-21 – 2019-08-22 (×3): 1000 mg via ORAL
  Filled 2019-08-21 (×4): qty 2

## 2019-08-21 MED ORDER — FENTANYL CITRATE (PF) 100 MCG/2ML IJ SOLN
INTRAMUSCULAR | Status: DC | PRN
  Administered 2019-08-21: 15 ug via INTRATHECAL
  Administered 2019-08-21: 85 ug via INTRAVENOUS

## 2019-08-21 MED ORDER — BUPIVACAINE HCL 0.5 % IJ SOLN
INTRAMUSCULAR | Status: DC | PRN
  Administered 2019-08-21: 8 mL

## 2019-08-21 MED ORDER — FENTANYL CITRATE (PF) 100 MCG/2ML IJ SOLN: 25.0000 ug | INTRAMUSCULAR | Status: DC | PRN

## 2019-08-21 MED ORDER — PROPOFOL 10 MG/ML IV BOLUS
INTRAVENOUS | Status: AC
  Filled 2019-08-21: qty 20

## 2019-08-21 MED ORDER — TRAMADOL HCL 50 MG PO TABS
100.0000 mg | ORAL_TABLET | Freq: Four times a day (QID) | ORAL | Status: DC
  Administered 2019-08-22: 100 mg via ORAL
  Filled 2019-08-21: qty 2

## 2019-08-21 MED ORDER — BUPRENORPHINE HCL 2 MG SL SUBL
8.0000 mg | SUBLINGUAL_TABLET | Freq: Two times a day (BID) | SUBLINGUAL | Status: DC
  Administered 2019-08-21: 4 mg via SUBLINGUAL
  Administered 2019-08-22 (×2): 8 mg via SUBLINGUAL
  Filled 2019-08-21 (×3): qty 4

## 2019-08-21 MED ORDER — FENTANYL CITRATE (PF) 100 MCG/2ML IJ SOLN
INTRAMUSCULAR | Status: AC
  Filled 2019-08-21: qty 2

## 2019-08-21 MED ORDER — LIDOCAINE HCL (CARDIAC) PF 100 MG/5ML IV SOSY
PREFILLED_SYRINGE | INTRAVENOUS | Status: DC | PRN
  Administered 2019-08-21: 100 mg via INTRAVENOUS

## 2019-08-21 SURGICAL SUPPLY — 25 items
BLADE SURG SZ11 CARB STEEL (BLADE) ×3 IMPLANT
CHLORAPREP W/TINT 26 (MISCELLANEOUS) ×3 IMPLANT
COVER WAND RF STERILE (DRAPES) IMPLANT
DERMABOND ADVANCED (GAUZE/BANDAGES/DRESSINGS) ×2
DERMABOND ADVANCED .7 DNX12 (GAUZE/BANDAGES/DRESSINGS) ×1 IMPLANT
DRAPE LAPAROTOMY 100X77 ABD (DRAPES) ×3 IMPLANT
GLOVE BIO SURGEON STRL SZ 6.5 (GLOVE) ×2 IMPLANT
GLOVE BIO SURGEONS STRL SZ 6.5 (GLOVE) ×1
GLOVE INDICATOR 7.0 STRL GRN (GLOVE) ×3 IMPLANT
GOWN STRL REUS W/ TWL LRG LVL3 (GOWN DISPOSABLE) ×2 IMPLANT
GOWN STRL REUS W/TWL LRG LVL3 (GOWN DISPOSABLE) ×4
KIT TURNOVER CYSTO (KITS) ×3 IMPLANT
NEEDLE HYPO 25GX1X1/2 BEV (NEEDLE) ×3 IMPLANT
NS IRRIG 500ML POUR BTL (IV SOLUTION) ×3 IMPLANT
PACK BASIN MINOR ARMC (MISCELLANEOUS) ×3 IMPLANT
SUT MNCRL 4-0 (SUTURE) ×2
SUT MNCRL 4-0 27XMFL (SUTURE) ×1
SUT PLAIN GUT 0 (SUTURE) ×6 IMPLANT
SUT VIC AB 0 CT1 36 (SUTURE) IMPLANT
SUT VIC AB 0 SH 27 (SUTURE) IMPLANT
SUT VIC AB 3-0 SH 27 (SUTURE) ×2
SUT VIC AB 3-0 SH 27X BRD (SUTURE) ×1 IMPLANT
SUT VICRYL 0 AB UR-6 (SUTURE) ×6 IMPLANT
SUTURE MNCRL 4-0 27XMF (SUTURE) ×1 IMPLANT
SYR 10ML LL (SYRINGE) ×3 IMPLANT

## 2019-08-21 NOTE — Progress Notes (Signed)
Dr. Marcelline Mates called around 8pm due to pt in 10/10 pain with no relief after trying numerous interventions and patient receiving all pain medications ordered.   New orders put in. Attempted to calm patient down and explain she had to feed her baby and once she calmed down she stated her pain had improved.   Will continue to monitor.

## 2019-08-21 NOTE — Anesthesia Procedure Notes (Signed)
Spinal  Patient location during procedure: OR Start time: 08/21/2019 1:43 PM End time: 08/21/2019 1:52 PM Staffing Anesthesiologist: Emmie Niemann, MD Resident/CRNA: Demetrius Charity, CRNA Performed: resident/CRNA and anesthesiologist  Preanesthetic Checklist Completed: patient identified, site marked, surgical consent, pre-op evaluation, timeout performed, IV checked, risks and benefits discussed and monitors and equipment checked Spinal Block Patient position: sitting Prep: ChloraPrep Patient monitoring: heart rate, continuous pulse ox and blood pressure Approach: midline Location: L4-5 Injection technique: single-shot Needle Needle type: Introducer and Pencil-Tip  Needle gauge: 24 G Needle length: 9 cm Additional Notes Negative paresthesia. Negative blood return. Positive free-flowing CSF. Expiration date of kit checked and confirmed. Patient tolerated procedure well, without complications.

## 2019-08-21 NOTE — Lactation Note (Signed)
This note was copied from a baby's chart. Lactation Consultation Note  Patient Name: Bethany Ramsey [id number]'P Date: 08/21/2019    [facility] checked in with mom after returning from tubal ligation.  Mom reported early feedings this morning to have gone well, but not lengthy in time.  RN assisted with last latch, baby latched without shield for approximately 9-12 minutes. Baby was asleep in football position with baby appearing content. Attempts made to wake baby and re-elicit a desire to feed, unsuccessful. Reviewed with mom newborn feeding patterns, cluster feeding, importance of feeding on cues, tips for obtaining a deep latch with flanged lips, and signs of milk transfer.  Encouraged mom to continue tracking wet/stool diapers, and feeding times.  LC name/number written on whiteboard, encouraged to call out for ongoing support.   Maternal Data    Feeding Feeding Type: Breast Fed  [facility] Score                   Interventions    Lactation Tools Discussed/Used     Consult Status      [name] 08/21/2019, 4:33 PM

## 2019-08-21 NOTE — Anesthesia Post-op Follow-up Note (Signed)
Anesthesia QCDR form completed.        

## 2019-08-21 NOTE — Op Note (Signed)
Procedure(s): POST PARTUM TUBAL LIGATION Procedure Note  Bethany Ramsey female 28 y.o. 08/21/2019  Indications: The patient is a 28 y.o. [id number] female, postpartum Day #1 s/p NSVD, desiring permanent sterilization.   Pre-operative Diagnosis: s/p NSVD, desires permanent sterilization, moderate obesity (BMI 37), Subutex therapy  Post-operative Diagnosis: Same  Surgeon: Rubie Maid, MD  Assistants: None  Anesthesia: Spinal anesthesia  Findings: The fundal uterus tubes and ovaries appeared normal. Uterus 1 fingerbreadth below the umbilicus.  Filmy adhesions of the right fallopian tube to the peritoneum.   Procedure Details: The patient was seen in the Holding Room. The risks, benefits, complications, treatment options, and expected outcomes were discussed with the patient.  The patient concurred with the proposed plan, giving informed consent.  The site of surgery properly noted/marked. The patient was taken to the Operating Room, identified as Bethany Ramsey and the procedure verified as Procedure(s) (LRB): POST PARTUM TUBAL LIGATION (Bilateral). A Time Out was held and the above information confirmed.  The patient was taken to the operating room where she was placed under spinal anesthesia without difficulty.   She was then placed in the dorsal supine position and prepped and draped in sterile fashion.  After an adequate timeout was performed, attention was turned to the patient's abdomen where a small transverse skin incision was made under the umbilical fold. The incision was taken down to the layer of fascia using the scalpel, and fascia was incised, and extended bilaterally using Mayo scissors. The peritoneum was entered in a sharp fashion. Attention was then turned to the patient's uterus, and left fallopian tube was identified and followed out to the fimbriated end. The Babcock clamp was then used to grasp the tube approximately 4 cm from the cornual region.  A 3 cm segment of tube was  then ligated with a free tie of 0-Chromic using the [place] method and excised.  A similar process was carried out on the right side allowing for bilateral tubal sterilization.  Good hemostasis was noted overall. The instruments were then removed from the patient's abdomen and the fascial incision was repaired with 0 Vicryl, and injected with 4 ml of 5% Bupivacaine.  The skin was closed with a 4-0 Vicryl subcuticular stitch, and then also injected with another 4 ml of 5% Bupivicaine. Dermabond was placed over the incision. The patient tolerated the procedure well.  Instrument, sponge, and needle counts were correct times three.  The patient was then taken to the recovery room awake and in stable condition.    Estimated Blood Loss:  Minimal.       Drains: None. Patient voided prior to procedure.          Total IV Fluids:  400 ml  Specimens: Bilateral segments of fallopian tubes.          Implants: None         Complications:  None; patient tolerated the procedure well.         Disposition: PACU - hemodynamically stable.         Condition: stable   Rubie Maid, MD [facility]

## 2019-08-21 NOTE — Progress Notes (Signed)
Pt spinal progressing down legs slowly. Pt is hemodynamically stable at this time. Dr. Amie Critchley notified. Acknowledged. Stated pt could go back to room 337.

## 2019-08-21 NOTE — Transfer of Care (Signed)
Immediate Anesthesia Transfer of Care Note  Patient: Bethany Ramsey  Procedure(s) Performed: POST PARTUM TUBAL LIGATION (Bilateral )  Patient Location: PACU  Anesthesia Type:Spinal  Level of Consciousness: awake, alert  and oriented  Airway & Oxygen Therapy: Patient Spontanous Breathing  Post-op Assessment: Report given to RN and Post -op Vital signs reviewed and stable  Post vital signs: Reviewed and stable  Last Vitals:  Vitals Value Taken Time  BP 95/48   Temp    Pulse 84   Resp 14   SpO2 97     Last Pain:  Vitals:   08/21/19 1316  TempSrc:   PainSc: 0-No pain      Patients Stated Pain Goal: 0 (41/42/39 5320)  Complications: No apparent anesthesia complications

## 2019-08-21 NOTE — Anesthesia Postprocedure Evaluation (Deleted)
Anesthesia Post Note  Patient: Bethany Ramsey  Procedure(s) Performed: AN AD [place]  Patient location during evaluation: Mother Baby Anesthesia Type: Epidural Level of consciousness: awake and alert Pain management: pain level controlled Vital Signs Assessment: post-procedure vital signs reviewed and stable Respiratory status: spontaneous breathing, nonlabored ventilation and respiratory function stable Cardiovascular status: stable Postop Assessment: no headache, no backache and epidural receding Anesthetic complications: no     Last Vitals:  Vitals:   08/21/19 0430 08/21/19 0751  BP: 138/84 118/68  Pulse: 84 60  Resp: 18 18  Temp: 36.8 C 37.1 C  SpO2: 100% 99%    Last Pain:  Vitals:   08/21/19 0751  TempSrc: Oral  PainSc:                  [name]

## 2019-08-21 NOTE — Progress Notes (Signed)
Post Partum Day 1 Subjective: no complaints, up ad lib, voiding and refuses suboxone as it makes her feel funny and nauseated, NPO for BTL today at noon  Objective: Blood pressure 118/68, pulse 60, temperature 98.7 F (37.1 C), temperature source Oral, resp. rate 18, height 5\' 9"  (1.753 m), weight 115.7 kg, last menstrual period 11/06/2018, SpO2 99 %, unknown if currently breastfeeding.  Physical Exam:  General: alert, cooperative and appears stated age Lochia: appropriate Uterine Fundus: firm Incision: NA DVT Evaluation: No evidence of DVT seen on physical exam. Negative Homan's sign.  Recent Labs    08/20/19 1200 08/21/19 0533  HGB 10.7* 9.1*  HCT 33.2* 28.6*    Assessment/Plan: Plan for discharge tomorrow and Social Work consult, for PPTL today at noon subutex 8mg  bid ordered, Infant feeding  Both    LOS: 1 day   Melody N Shambley 08/21/2019, 9:38 AM

## 2019-08-21 NOTE — Anesthesia Postprocedure Evaluation (Signed)
Anesthesia Post Note  Patient: Bethany Ramsey  Procedure(s) Performed: AN AD [place]  Patient location during evaluation: Mother Baby Anesthesia Type: Epidural Level of consciousness: awake and alert Pain management: pain level controlled Vital Signs Assessment: post-procedure vital signs reviewed and stable Respiratory status: spontaneous breathing, nonlabored ventilation and respiratory function stable Cardiovascular status: stable Postop Assessment: no headache, no backache and epidural receding Anesthetic complications: no     Last Vitals:  Vitals:   08/21/19 1154 08/21/19 1316  BP: 134/73 127/77  Pulse: 80 69  Resp: 18 18  Temp: 36.9 C 36.4 C  SpO2: 98% 97%    Last Pain:  Vitals:   08/21/19 1316  TempSrc:   PainSc: 0-No pain                 Rafe Mackowski B Clarisa Kindred

## 2019-08-21 NOTE — Anesthesia Procedure Notes (Signed)
Performed by: Reily Ilic, CRNA Pre-anesthesia Checklist: Patient identified, Emergency Drugs available, Suction available, Patient being monitored and Timeout performed Oxygen Delivery Method: Simple face mask       

## 2019-08-21 NOTE — Clinical Social Work Maternal (Signed)
CLINICAL SOCIAL WORK MATERNAL/CHILD NOTE  Patient Details  Name: Bethany Ramsey MRN: [id number] Date of Birth: 25-Mar-1991  Date:  08/21/2019  Clinical Social Worker Initiating Note:  [employer], LCSW Date/Time: Initiated:  08/21/19/1356     Child's Name:  Bethany Ramsey   Biological Parents:  Mother   Need for Interpreter:  None   Reason for Referral:  Current Substance Use/Substance Use During Pregnancy    Address:  [address] [place] [address]    Phone number:  [phone] (home)     Additional phone number:   Household Members/Support Persons (HM/SP):   Household Member/Support Person 1   HM/SP Name Relationship DOB or Age  HM/SP -Bethany Ramsey Mother    HM/SP -2        HM/SP -3        HM/SP -4        HM/SP -5        HM/SP -6        HM/SP -7        HM/SP -8          Natural Supports (not living in the home):  Extended Family   Professional Supports: Therapist([facility])   Employment: Homemaker   Type of Work:     Education:  Nurse, adult   Homebound arranged:    Museum/gallery curator Resources:  Medicaid   Other Resources:  [facility]   Cultural/Religious Considerations Which May Impact Care:    Strengths:      Psychotropic Medications:         Pediatrician:       Pediatrician List:   [place]      Pediatrician Fax Number:    Risk Factors/Current Problems:  Substance Use    Cognitive State:  Alert    Mood/Affect:  Other (Comment)(Guarded)   CSW Assessment: Clinical Education officer, museum (CSW) received consult for drug exposed newborn. Infant's urine drug screen is positive for marijuana. Infant's cord tissue screen is pending. Mother's urine drug screen is positive for marijuana. Per RN mother is prescribed subutex. Per RN there are no other concerns. CSW met with patient and infant Bethany Ramsey was at bedside.  Maternal grandmother Bethany Ramsey was also at bedside. Bethany Ramsey stepped out of the room while CSW was completing assessment. Mother was alert and oriented X4 and was holding infant. CSW introduced self and explained role of CSW department. Per mother she lives in [place] with her mother Bethany Ramsey. Mother reported that Bethany Ramsey is her 2nd baby and she has a 71 y.o son named Bethany Ramsey. Per mother Bethany Ramsey also lives in the home with her. Per mother the father of the infant is not supportive and not involved. Mother reported that she has all the supplies needed for infant including car seat and crib. Mother reported that she is not depressed and did not experience postpartum depression with her 1st baby. Mother reported that she has Medicaid and knows how to apply for [facility]. Mother reported that she goes to [place] [facility] and gets her subutex there. Mother reported that she used marijuana during her pregnancy to help with nausea. Mother reported that she did not use any other drugs or drink alcohol during her pregnancy. CSW made mother aware that infant's urine drug screen was positive for marijuana so a child protective services (CPS) report will be made. Mother  verbalized her understanding. CSW provided mother with [facility].  CSW made an [place] report today.      CSW Plan/Description:  Child Protective Service Report     Bethany Ramsey, Bethany Ramsey 08/21/2019, 2:02 PM

## 2019-08-21 NOTE — Progress Notes (Signed)
OBSTETRICS AND GYNECOLOGY PRE-OPERATIVE NOTE   Pre-Op Diagnosis: Multiparity, desiring permanent sterilization  Planned Procedure: Postpartum tubal ligation  Surgeons: Rubie Maid, MD  Anesthesia: General  Blood: Type and screened  Labs:  CBC Latest Ref Rng & Units 08/21/2019 08/20/2019  WBC 4.0 - 10.5 K/uL 9.1 8.7  Hemoglobin 12.0 - 15.0 g/dL 9.1(L) 10.7(L)  Hematocrit 36.0 - 46.0 % 28.6(L) 33.2(L)  Platelets 150 - 400 K/uL 121(L) 174    Lab Results  Component Value Date   ABORH A POS 08/20/2019    Antibiotics: None Needed   Consent: Signed and on chart   Pre-Op BTL Consent Consent: Surgical consent and tubal sterilization. Alternatives to sterilizations are documented on the surgical consent that has been signed by the patient. Patient confirms that she has been counseled about permanent sterilization on mutiple occasions during her antepartum course and during this admission. She understands the alternatives to permanents include: oral contraceptive pills, depot provera, patch, ring, intrauterine device (5 year or 10 year), Implanon, condoms and cervical caps/diaphram.  She states that she desires the procedure. She has been NPO since midnight and will remain NPO for her procedure. To OR when ready.    Rubie Maid, MD [facility]

## 2019-08-22 ENCOUNTER — Encounter: Payer: Self-pay | Admitting: Obstetrics and Gynecology

## 2019-08-22 ENCOUNTER — Telehealth: Payer: Self-pay | Admitting: Obstetrics and Gynecology

## 2019-08-22 MED ORDER — FERROUS SULFATE 325 (65 FE) MG PO TABS: 325.0000 mg | ORAL_TABLET | Freq: Two times a day (BID) | ORAL | 3 refills | Status: DC

## 2019-08-22 MED ORDER — IBUPROFEN 600 MG PO TABS
600.0000 mg | ORAL_TABLET | Freq: Four times a day (QID) | ORAL | Status: DC
  Administered 2019-08-22 (×2): 600 mg via ORAL
  Filled 2019-08-22 (×2): qty 1

## 2019-08-22 MED ORDER — IBUPROFEN 600 MG PO TABS
600.0000 mg | ORAL_TABLET | Freq: Four times a day (QID) | ORAL | Status: DC
  Administered 2019-08-22: 600 mg via ORAL
  Filled 2019-08-22: qty 1

## 2019-08-22 MED ORDER — DOCUSATE SODIUM 100 MG PO CAPS: 100.0000 mg | ORAL_CAPSULE | Freq: Every day | ORAL | 0 refills | Status: DC

## 2019-08-22 MED ORDER — HYDROCODONE-ACETAMINOPHEN 5-325 MG PO TABS: 1.0000 | ORAL_TABLET | ORAL | 0 refills | Status: AC | PRN

## 2019-08-22 MED ORDER — VITAMIN D3 125 MCG (5000 UT) PO CAPS: 1.0000 | ORAL_CAPSULE | Freq: Every day | ORAL | 2 refills | Status: DC

## 2019-08-22 NOTE — Anesthesia Postprocedure Evaluation (Signed)
Anesthesia Post Note  Patient: Camila Li  Procedure(s) Performed: AN AD [place]  Patient location during evaluation: Mother Baby Anesthesia Type: Epidural Level of consciousness: awake and alert Pain management: pain level controlled Vital Signs Assessment: post-procedure vital signs reviewed and stable Respiratory status: spontaneous breathing, nonlabored ventilation and respiratory function stable Cardiovascular status: stable Postop Assessment: no headache, no backache and epidural receding Anesthetic complications: no     Last Vitals:  Vitals:   08/21/19 2351 08/22/19 0736  BP: (!) 152/82 110/66  Pulse: 69 65  Resp: 20 20  Temp:  37.1 C  SpO2: 97% 97%    Last Pain:  Vitals:   08/22/19 0736  TempSrc: Oral  PainSc:                  Hedda Slade

## 2019-08-22 NOTE — Telephone Encounter (Signed)
Can you send medication to different pharmacy.

## 2019-08-22 NOTE — Telephone Encounter (Signed)
The patient called and stated that she needs the medication HYDROcodone-acetaminophen (NORCO/VICODIN) 5-325 MG tablet [[id number]] sent to [facility] in [place] instead of [facility] on [address]. Pt is requesting a call back, Please advise.

## 2019-08-22 NOTE — Discharge Summary (Signed)
Obstetric Discharge Summary Reason for Admission: onset of labor Prenatal Procedures: NST and ultrasound Intrapartum Procedures: spontaneous vaginal delivery Postpartum Procedures: P.P. tubal ligation Complications-Operative and Postpartum: postpartum anemia  Delivery Note At 4:10 PM a viable and healthy female was delivered via Vaginal, Spontaneous (Presentation: ;  ).  APGAR: 9, 10; weight 7 lb 9.3 oz (3440 g).    .     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

## 2019-08-22 NOTE — Progress Notes (Signed)
Pt states she is feeling much better and apologizes for the way she was yelling and crying out earlier in the night. I explained that we all understood her pain and we needed to keep it under control throughout the night.   Refused the tramadol.   Pt was breastfeeding her baby and then planned to try to get some sleep. Explained that we did not need to come in until 0400 but to call out if she felt like she needed something.   Will continue to monitor.

## 2019-08-22 NOTE — Lactation Note (Signed)
This note was copied from a baby's chart. Lactation Consultation Note  Patient Name: Bethany Ramsey WOEHO'Z Date: 08/22/2019   Baby tested (+) for MJ 08/21/2019 at 3 am.  Eat, sleep, console baby.  Observed good breast feeding with mom needing minimal assistance.  Mom had been using nipple shield, but is latching without nipple shield now.  Discussed lactation community resources with contact numbers given.  Lactation name and number written on white board and encouraged to call with any questions, concerns or assistance.  Maternal Data    Feeding Feeding Type: Breast Fed  LATCH Score                   Interventions    Lactation Tools Discussed/Used     Consult Status      Jarold Motto 08/22/2019, 9:33 PM

## 2019-08-23 ENCOUNTER — Encounter: Payer: Self-pay | Admitting: Obstetrics and Gynecology

## 2019-08-23 ENCOUNTER — Ambulatory Visit: Payer: Self-pay

## 2019-08-23 LAB — SURGICAL PATHOLOGY

## 2019-08-23 NOTE — Lactation Note (Signed)
This note was copied from a baby's chart. Lactation Consultation Note  Patient Name: Bethany Ramsey ENIDP'O Date: 08/23/2019 Reason for consult: Follow-up assessment;Mother's request   Maternal Data  MOm requested to pump breasts with Medela Symphony, demonstrated use of pump, storage and cleaning, mom pumped 1oz total from both breasts    Feeding Feeding Type: Bottle Fed - Breast Milk Nipple Type: Extra Slow Flow Fed to baby by grandmother LATCH Score                   Interventions Interventions: DEBP  Lactation Tools Discussed/Used Tools: Pump;Bottle Breast pump type: Double-Electric Breast Pump   Consult Status Consult Status: PRN Date: 08/24/19 Follow-up type: In-patient    Ferol Luz 08/23/2019, 6:00 PM

## 2019-08-23 NOTE — Lactation Note (Signed)
This note was copied from a baby's chart. Lactation Consultation Note  Patient Name: Bethany Ramsey [id number] Date: 08/23/2019 Reason for consult: Follow-up assessment;Other (Comment)(eat sleep console) Mom has a used Medela breast pump her sister bought her, I gave her a new pump kit and set up DEBP for her to use to provide supplement if needed instead of formula, I encouraged her to feed baby on both breasts each feeding  Maternal Data Formula Feeding for Exclusion: No MIlk increasing, leaking from both breasts while feeding on the other Feeding Feeding Type: Breast Fed Gulping at breast, sl fussy and pulls off, decreased as flow slowed from breast, can latch without shield, but pulls off more frequently and cries LATCH Score Latch: Grasps breast easily, tongue down, lips flanged, rhythmical sucking.  Audible Swallowing: Spontaneous and intermittent  Type of Nipple: Everted at rest and after stimulation  Comfort (Breast/Nipple): Filling, red/small blisters or bruises, mild/mod discomfort  Hold (Positioning): Assistance needed to correctly position infant at breast and maintain latch.  LATCH Score: 8  Interventions Interventions: Assisted with latch;Adjust position;Support pillows;DEBP  Lactation Tools Discussed/Used Tools: Nipple Shields Nipple shield size: 20 WIC Program: No Pump Review: Setup, frequency, and cleaning;Milk Storage Initiated by:: Chaya Jan RNC IBCLC Date initiated:: 08/23/19   Consult Status Consult Status: Follow-up Date: 08/23/19 Follow-up type: In-patient    Ferol Luz 08/23/2019, 12:43 PM

## 2019-08-23 NOTE — Anesthesia Postprocedure Evaluation (Signed)
Anesthesia Post Note  Patient: Bethany Ramsey  Procedure(s) Performed: POST PARTUM TUBAL LIGATION (Bilateral )  Patient location during evaluation: PACU Anesthesia Type: Spinal Level of consciousness: oriented and awake and alert Pain management: pain level controlled Vital Signs Assessment: post-procedure vital signs reviewed and stable Respiratory status: spontaneous breathing, respiratory function stable and nonlabored ventilation Cardiovascular status: blood pressure returned to baseline and stable Postop Assessment: no headache, no backache and spinal receding Anesthetic complications: no     Last Vitals:  Vitals:   08/22/19 1924 08/22/19 2313  BP: (!) 142/88 138/83  Pulse: 84 78  Resp: 18 18  Temp: 36.9 C 36.8 C  SpO2: 100% 100%    Last Pain:  Vitals:   08/22/19 2313  TempSrc: Oral  PainSc:                  Valeree Leidy

## 2019-08-23 NOTE — Anesthesia Preprocedure Evaluation (Signed)
Anesthesia Evaluation  Patient identified by MRN, date of birth, ID band Patient awake    Reviewed: Allergy & Precautions, NPO status , Patient's Chart, lab work & pertinent test results  History of Anesthesia Complications Negative for: history of anesthetic complications  Airway Mallampati: III  TM Distance: >3 FB Neck ROM: Full    Dental no notable dental hx.    Pulmonary neg sleep apnea, neg COPD, former smoker,    breath sounds clear to auscultation- rhonchi (-) wheezing      Cardiovascular hypertension, (-) CAD, (-) Past MI, (-) Cardiac Stents and (-) CABG  Rhythm:Regular Rate:Normal - Systolic murmurs and - Diastolic murmurs    Neuro/Psych neg Seizures negative neurological ROS  negative psych ROS   GI/Hepatic negative GI ROS, Neg liver ROS,   Endo/Other  negative endocrine ROSneg diabetes  Renal/GU negative Renal ROS     Musculoskeletal negative musculoskeletal ROS (+)   Abdominal (+) + obese,   Peds  Hematology  (+) anemia ,   Anesthesia Other Findings Past Medical History: No date: Drug abuse and dependence ([place]) No date: Miscarriage No date: Obesity (BMI 30-39.9) No date: Preeclampsia     Comment:  when pregnant with son 2013   Reproductive/Obstetrics (+) Breast feeding                              Lab Results  Component Value Date   WBC 9.1 08/21/2019   HGB 9.1 (L) 08/21/2019   HCT 28.6 (L) 08/21/2019   MCV 89.4 08/21/2019   PLT 121 (L) 08/21/2019    Anesthesia Physical Anesthesia Plan  ASA: II  Anesthesia Plan: Spinal   Post-op Pain Management:    Induction:   PONV Risk Score and Plan: 2 and Ondansetron and Midazolam  Airway Management Planned: Natural Airway  Additional Equipment:   Intra-op Plan:   Post-operative Plan:   Informed Consent: I have reviewed the patients History and Physical, chart, labs and discussed the procedure including the  risks, benefits and alternatives for the proposed anesthesia with the patient or authorized representative who has indicated his/her understanding and acceptance.     Dental advisory given  Plan Discussed with: CRNA and Anesthesiologist  Anesthesia Plan Comments:         Anesthesia Quick Evaluation

## 2019-08-26 NOTE — Progress Notes (Signed)
10/12: Clinical Education officer, museum (CSW) contacted [employer] child protective services (CPS) intake worker Chrissie Noa and made him aware that infant's cord tissue had THC and Norbuprenorphine present. Per Chrissie Noa there is an open CPS case and he will email the CPS worker with this additional information.   [employer], LCSW [phone]

## 2019-08-31 ENCOUNTER — Encounter: Payer: Self-pay | Admitting: Obstetrics and Gynecology

## 2019-09-03 ENCOUNTER — Telehealth: Payer: Self-pay | Admitting: Obstetrics and Gynecology

## 2019-09-03 NOTE — Telephone Encounter (Signed)
Letter has been done and patient is aware.

## 2019-09-03 NOTE — Telephone Encounter (Signed)
The patient called and stated that she needs to speak with a provider or nurse in regards to her getting her breast pump that she needs covered by her insurance. Pt is requesting a call back.

## 2019-09-03 NOTE — Telephone Encounter (Signed)
Bethany Ramsey,   That fine, just type up a letter that she is exclusively breasting feeing her infant.   Thanks,  Deneise Lever

## 2019-10-01 ENCOUNTER — Telehealth: Payer: Self-pay

## 2019-10-01 ENCOUNTER — Encounter: Payer: Self-pay | Admitting: *Deleted

## 2019-10-21 NOTE — Telephone Encounter (Signed)
error 

## 2019-11-05 ENCOUNTER — Telehealth: Payer: Self-pay

## 2019-11-05 ENCOUNTER — Telehealth: Payer: Self-pay | Admitting: Certified Nurse Midwife

## 2019-11-05 NOTE — Telephone Encounter (Signed)
Pt needs a letter stated she is still breast feeding. [employer]

## 2019-11-05 NOTE — Telephone Encounter (Signed)
Breast feeding note done.

## 2019-11-05 NOTE — Telephone Encounter (Signed)
Pt called to request a letter stating that she breast feeds

## 2019-11-06 NOTE — Telephone Encounter (Signed)
Letter written by SS.  No further action required.  Chart will be filed.

## 2019-12-17 ENCOUNTER — Telehealth: Payer: Self-pay | Admitting: Certified Nurse Midwife

## 2019-12-17 NOTE — Telephone Encounter (Signed)
The pt called in and stated that she needs a letter stating that the pt  breast feeds sent to [facility]  Fax number [phone]

## 2019-12-17 NOTE — Telephone Encounter (Signed)
Letter faxed and MyChart message sent to make patient aware.  Attempted to contact patient but phone number on file is not receiving calls at this time.

## 2020-01-14 ENCOUNTER — Telehealth: Payer: Self-pay | Admitting: Certified Nurse Midwife

## 2020-01-14 NOTE — Telephone Encounter (Signed)
Patient called saying she needed a note sent over to [facility] with a prior authorization stating she is still breast feeding. Could you please advise this patient?  Thank you, Ardelle Lesches

## 2020-01-15 NOTE — Telephone Encounter (Signed)
Called patient to get clarification on what kind of prior authorization she needs.  Telephone number on file is disconnected.  MyChart message sent.  See MyChart message for further actions.

## 2020-03-10 ENCOUNTER — Telehealth: Payer: Self-pay | Admitting: Certified Nurse Midwife

## 2020-03-10 NOTE — Telephone Encounter (Signed)
mychart message sent to patient- letter has been faxed and confirmation of receipt was received.

## 2020-03-10 NOTE — Telephone Encounter (Signed)
pt called saying she needed a note sent over to [facility] with a prior authorization stating she is still breast feeding. Please advise  Here is the fax number  [phone]

## 2020-05-05 ENCOUNTER — Telehealth: Payer: Self-pay | Admitting: Certified Nurse Midwife

## 2020-05-05 NOTE — Progress Notes (Signed)
Letter faxed.

## 2020-05-05 NOTE — Telephone Encounter (Signed)
ptcalled stating that she needs a letter sent to [facility] with a prior authorization. She stated that she needs to let them know she is still breast feeding. Please advise Here is the fax number  [phone] 

## 2020-07-03 ENCOUNTER — Telehealth: Payer: Self-pay

## 2020-07-03 NOTE — Telephone Encounter (Signed)
Pt needs a letter stating she is BF in order for Medicaid to cover her subutex.   Pt aware [facility] is out of the office.   Pls advise.

## 2020-07-03 NOTE — Telephone Encounter (Signed)
She did not say.   My chart is not working. Server issues.    This letter have been given to her for the last 9 months. How did she get in the past? ty  CM

## 2020-07-03 NOTE — Telephone Encounter (Signed)
Letter written

## 2020-07-06 ENCOUNTER — Other Ambulatory Visit: Payer: Self-pay

## 2020-07-06 ENCOUNTER — Other Ambulatory Visit: Payer: Medicaid Other

## 2020-07-06 DIAGNOSIS — Z20822 Contact with and (suspected) exposure to covid-19: Secondary | ICD-10-CM

## 2020-07-07 ENCOUNTER — Telehealth: Payer: Self-pay | Admitting: *Deleted

## 2020-07-07 NOTE — Telephone Encounter (Signed)
Patient called to check on result of COVID- advised still pending. Information about lab notification and parental proxy given to patient for future reference.

## 2020-07-08 LAB — NOVEL CORONAVIRUS, NAA: SARS-CoV-2, NAA: NOT DETECTED

## 2020-07-08 LAB — SARS-COV-2, NAA 2 DAY TAT

## 2020-09-02 ENCOUNTER — Telehealth: Payer: Self-pay

## 2020-09-02 NOTE — Telephone Encounter (Signed)
ptcalled stating that she needs a letter sent to [facility] with a prior authorization. She stated that she needs to let them know she is still breast feeding. Please advise Here is the fax number  [phone]

## 2020-09-02 NOTE — Telephone Encounter (Signed)
mychart message sent to patient

## 2020-09-03 ENCOUNTER — Telehealth: Payer: Self-pay

## 2020-09-03 NOTE — Telephone Encounter (Signed)
Breast feeding note faxed and confirmation of receipt received. Mychart message sent to patient

## 2020-10-23 ENCOUNTER — Encounter: Payer: Self-pay | Admitting: Surgical

## 2020-10-27 ENCOUNTER — Telehealth: Payer: Self-pay

## 2020-10-27 NOTE — Telephone Encounter (Signed)
Patient called in stating that she needs a letter stating that she is still breastfeeding to [facility]. Patient states we do this for her every month. Patient could not provide me the fax number to [facility]. Informed her I would send a message back to her provider to see what we could do for her.  Could you please advise? 

## 2020-10-27 NOTE — Telephone Encounter (Signed)
mychart message sent to patient- she needs an appointment. It has been well over 1 year since her last visit.

## 2020-10-28 ENCOUNTER — Telehealth: Payer: Self-pay

## 2020-10-28 NOTE — Telephone Encounter (Signed)
Pt called in and was read the message from the nurse. The pt made an appt for an annual. The pt is requesting a refill on her Subutex sent to [facility] on [address]. If we cant refill until pt is sent pt is requesting a call back.  Please advise

## 2020-10-28 NOTE — Telephone Encounter (Signed)
mychart message sent to patient- unable to refill subutex.

## 2020-11-05 NOTE — Progress Notes (Signed)
Annual exam-pt stated that she was doing well no problems.  

## 2020-11-05 NOTE — Patient Instructions (Signed)
Preventive Care 21-28 Years Old, Female Preventive care refers to visits with your health care provider and lifestyle choices that can promote health and wellness. This includes:  A yearly physical exam. This may also be called an annual well check.  Regular dental visits and eye exams.  Immunizations.  Screening for certain conditions.  Healthy lifestyle choices, such as eating a healthy diet, getting regular exercise, not using drugs or products that contain nicotine and tobacco, and limiting alcohol use. What can I expect for my preventive care visit? Physical exam Your health care provider will check your:  Height and weight. This may be used to calculate body mass index (BMI), which tells if you are at a healthy weight.  Heart rate and blood pressure.  Skin for abnormal spots. Counseling Your health care provider may ask you questions about your:  Alcohol, tobacco, and drug use.  Emotional well-being.  Home and relationship well-being.  Sexual activity.  Eating habits.  Work and work environment.  Method of birth control.  Menstrual cycle.  Pregnancy history. What immunizations do I need?  Influenza (flu) vaccine  This is recommended every year. Tetanus, diphtheria, and pertussis (Tdap) vaccine  You may need a Td booster every 10 years. Varicella (chickenpox) vaccine  You may need this if you have not been vaccinated. Human papillomavirus (HPV) vaccine  If recommended by your health care provider, you may need three doses over 6 months. Measles, mumps, and rubella (MMR) vaccine  You may need at least one dose of MMR. You may also need a second dose. Meningococcal conjugate (MenACWY) vaccine  One dose is recommended if you are age 19-21 years and a first-year college student living in a residence hall, or if you have one of several medical conditions. You may also need additional booster doses. Pneumococcal conjugate (PCV13) vaccine  You may need  this if you have certain conditions and were not previously vaccinated. Pneumococcal polysaccharide (PPSV23) vaccine  You may need one or two doses if you smoke cigarettes or if you have certain conditions. Hepatitis A vaccine  You may need this if you have certain conditions or if you travel or work in places where you may be exposed to hepatitis A. Hepatitis B vaccine  You may need this if you have certain conditions or if you travel or work in places where you may be exposed to hepatitis B. Haemophilus influenzae type b (Hib) vaccine  You may need this if you have certain conditions. You may receive vaccines as individual doses or as more than one vaccine together in one shot (combination vaccines). Talk with your health care provider about the risks and benefits of combination vaccines. What tests do I need?  Blood tests  Lipid and cholesterol levels. These may be checked every 5 years starting at age 20.  Hepatitis C test.  Hepatitis B test. Screening  Diabetes screening. This is done by checking your blood sugar (glucose) after you have not eaten for a while (fasting).  Sexually transmitted disease (STD) testing.  BRCA-related cancer screening. This may be done if you have a family history of breast, ovarian, tubal, or peritoneal cancers.  Pelvic exam and Pap test. This may be done every 3 years starting at age 21. Starting at age 30, this may be done every 5 years if you have a Pap test in combination with an HPV test. Talk with your health care provider about your test results, treatment options, and if necessary, the need for more tests.   Follow these instructions at home: Eating and drinking   Eat a diet that includes fresh fruits and vegetables, whole grains, lean protein, and low-fat dairy.  Take vitamin and mineral supplements as recommended by your health care provider.  Do not drink alcohol if: ? Your health care provider tells you not to drink. ? You are  pregnant, may be pregnant, or are planning to become pregnant.  If you drink alcohol: ? Limit how much you have to 0-1 drink a day. ? Be aware of how much alcohol is in your drink. In the [place] one drink equals one 12 oz bottle of beer (355 mL), one 5 oz glass of wine (148 mL), or one 1 oz glass of hard liquor (44 mL). Lifestyle  Take daily care of your teeth and gums.  Stay active. Exercise for at least 30 minutes on 5 or more days each week.  Do not use any products that contain nicotine or tobacco, such as cigarettes, e-cigarettes, and chewing tobacco. If you need help quitting, ask your health care provider.  If you are sexually active, practice safe sex. Use a condom or other form of birth control (contraception) in order to prevent pregnancy and STIs (sexually transmitted infections). If you plan to become pregnant, see your health care provider for a preconception visit. What's next?  Visit your health care provider once a year for a well check visit.  Ask your health care provider how often you should have your eyes and teeth checked.  Stay up to date on all vaccines. This information is not intended to replace advice given to you by your health care provider. Make sure you discuss any questions you have with your health care provider. Document Revised: 07/12/2018 Document Reviewed: 07/12/2018 Elsevier Patient Education  2020 Elsevier Inc. Breast Self-Awareness Breast self-awareness is knowing how your breasts look and feel. Doing breast self-awareness is important. It allows you to catch a breast problem early while it is still small and can be treated. All women should do breast self-awareness, including women who have had breast implants. Tell your doctor if you notice a change in your breasts. What you need:  A mirror.  A well-lit room. How to do a breast self-exam A breast self-exam is one way to learn what is normal for your breasts and to check for changes. To do a  breast self-exam: Look for changes  1. Take off all the clothes above your waist. 2. Stand in front of a mirror in a room with good lighting. 3. Put your hands on your hips. 4. Push your hands down. 5. Look at your breasts and nipples in the mirror to see if one breast or nipple looks different from the other. Check to see if: ? The shape of one breast is different. ? The size of one breast is different. ? There are wrinkles, dips, and bumps in one breast and not the other. 6. Look at each breast for changes in the skin, such as: ? Redness. ? Scaly areas. 7. Look for changes in your nipples, such as: ? Liquid around the nipples. ? Bleeding. ? Dimpling. ? Redness. ? A change in where the nipples are. Feel for changes  1. Lie on your back on the floor. 2. Feel each breast. To do this, follow these steps: ? Pick a breast to feel. ? Put the arm closest to that breast above your head. ? Use your other arm to feel the nipple area of your breast. Feel   the area with the pads of your three middle fingers by making small circles with your fingers. For the first circle, press lightly. For the second circle, press harder. For the third circle, press even harder. ? Keep making circles with your fingers at the different pressures as you move down your breast. Stop when you feel your ribs. ? Move your fingers a little toward the center of your body. ? Start making circles with your fingers again, this time going up until you reach your collarbone. ? Keep making up-and-down circles until you reach your armpit. Remember to keep using the three pressures. ? Feel the other breast in the same way. 3. Sit or stand in the tub or shower. 4. With soapy water on your skin, feel each breast the same way you did in step 2 when you were lying on the floor. Write down what you find Writing down what you find can help you remember what to tell your doctor. Write down:  What is normal for each breast.  Any  changes you find in each breast, including: ? The kind of changes you find. ? Whether you have pain. ? Size and location of any lumps.  When you last had your menstrual period. General tips  Check your breasts every month.  If you are breastfeeding, the best time to check your breasts is after you feed your baby or after you use a breast pump.  If you get menstrual periods, the best time to check your breasts is 5-7 days after your menstrual period is over.  With time, you will become comfortable with the self-exam, and you will begin to know if there are changes in your breasts. Contact a doctor if you:  See a change in the shape or size of your breasts or nipples.  See a change in the skin of your breast or nipples, such as red or scaly skin.  Have fluid coming from your nipples that is not normal.  Find a lump or thick area that was not there before.  Have pain in your breasts.  Have any concerns about your breast health. Summary  Breast self-awareness includes looking for changes in your breasts, as well as feeling for changes within your breasts.  Breast self-awareness should be done in front of a mirror in a well-lit room.  You should check your breasts every month. If you get menstrual periods, the best time to check your breasts is 5-7 days after your menstrual period is over.  Let your doctor know of any changes you see in your breasts, including changes in size, changes on the skin, pain or tenderness, or fluid from your nipples that is not normal. This information is not intended to replace advice given to you by your health care provider. Make sure you discuss any questions you have with your health care provider. Document Revised: 06/19/2018 Document Reviewed: 06/19/2018 Elsevier Patient Education  2020 Elsevier Inc.  

## 2020-11-09 ENCOUNTER — Other Ambulatory Visit: Payer: Self-pay

## 2020-11-09 ENCOUNTER — Ambulatory Visit (INDEPENDENT_AMBULATORY_CARE_PROVIDER_SITE_OTHER): Payer: Medicaid Other | Admitting: Certified Nurse Midwife

## 2020-11-09 ENCOUNTER — Encounter: Payer: Self-pay | Admitting: Certified Nurse Midwife

## 2020-11-09 VITALS — BP 137/82 | HR 77 | Ht 69.0 in | Wt 244.6 lb

## 2020-11-09 DIAGNOSIS — Z0000 Encounter for general adult medical examination without abnormal findings: Secondary | ICD-10-CM

## 2020-11-09 DIAGNOSIS — Z01419 Encounter for gynecological examination (general) (routine) without abnormal findings: Secondary | ICD-10-CM

## 2020-11-09 DIAGNOSIS — Z1159 Encounter for screening for other viral diseases: Secondary | ICD-10-CM

## 2020-11-09 DIAGNOSIS — Z23 Encounter for immunization: Secondary | ICD-10-CM | POA: Diagnosis not present

## 2020-11-09 DIAGNOSIS — Z13220 Encounter for screening for lipoid disorders: Secondary | ICD-10-CM

## 2020-11-09 NOTE — Progress Notes (Signed)
GYNECOLOGY ANNUAL PREVENTATIVE CARE ENCOUNTER NOTE  History:     Bethany Ramsey is a 29 y.o. [phone] female here for a routine annual gynecologic exam.  Current complaints: none.   Denies abnormal vaginal bleeding, discharge, pelvic pain, problems with intercourse or other gynecologic concerns.     Social Relationship: single Living: her mother and kids Work: none Exercise:3-4 times wk walks  Smoke/Alcohol/drug use: denies ( prescribed suboxone)   Gynecologic History Patient's last menstrual period was 11/06/2020. Contraception: tubal ligation Last Pap: 03/13/19. Results were: abnormal with negative HPV   Obstetric History OB History  Gravida Para Term Preterm AB Living  4 2 2   2 2   SAB IAB Ectopic Multiple Live Births  1     0 2    # Outcome Date GA Lbr Len/2nd Weight Sex Delivery Anes PTL Lv  4 Term 08/20/19 [redacted]w[redacted]d / 00:20 7 lb 9.3 oz (3.44 kg) F Vag-Spont EPI  LIV  3 SAB 2019          2 AB 2018          1 Term 2013   7 lb (3.175 kg) M Vag-Spont  N LIV    Past Medical History:  Diagnosis Date   Drug abuse and dependence ([facility])    Miscarriage    Obesity (BMI 30-39.9)    Preeclampsia    when pregnant with son 2013    Past Surgical History:  Procedure Laterality Date   TUBAL LIGATION Bilateral 08/21/2019   Procedure: POST PARTUM TUBAL LIGATION;  Surgeon: 10/21/2019, MD;  Location: [facility] ORS;  Service: Gynecology;  Laterality: Bilateral;    Current Outpatient Medications on File Prior to Visit  Medication Sig Dispense Refill   buprenorphine (SUBUTEX) 8 MG SUBL SL tablet Place 8 mg under the tongue 2 (two) times daily.     Cholecalciferol (VITAMIN D3) 125 MCG (5000 UT) CAPS Take 1 capsule (5,000 Units total) by mouth daily. 120 capsule 2   docusate sodium (COLACE) 100 MG capsule Take 1 capsule (100 mg total) by mouth daily. 100 capsule 0   ferrous sulfate 325 (65 FE) MG tablet Take 1 tablet (325 mg total) by mouth 2 (two) times daily with a meal. 60  tablet 3   Prenatal Vit-Fe Fumarate-FA (PRENATAL MULTIVITAMIN) TABS tablet Take 1 tablet by mouth daily at 12 noon.     No current facility-administered medications on file prior to visit.    No Known Allergies  Social History:  reports that she quit smoking about 15 months ago. Her smoking use included cigarettes. She smoked 1.00 pack per day. She has never used smokeless tobacco. She reports current drug use. Drug: Marijuana. She reports that she does not drink alcohol.  Family History  Problem Relation Age of Onset   Stroke Maternal Grandmother    Stroke Maternal Grandfather    Stroke Sister     The following portions of the patient's history were reviewed and updated as appropriate: allergies, current medications, past family history, past medical history, past social history, past surgical history and problem list.  Review of Systems Pertinent items noted in HPI and remainder of comprehensive ROS otherwise negative.  Physical Exam:  BP 137/82    Pulse 77    Ht 5\' 9"  (1.753 m)    Wt 244 lb 9.6 oz (110.9 kg)    LMP 11/06/2020    BMI 36.12 kg/m  CONSTITUTIONAL: Well-developed, over weight, well-nourished female in no acute distress.  HENT:  Normocephalic,  atraumatic, External right and left ear normal. Oropharynx is clear and moist EYES: Conjunctivae and EOM are normal. Pupils are equal, round, and reactive to light. No scleral icterus.  NECK: Normal range of motion, supple, no masses.  Normal thyroid.  SKIN: Skin is warm and dry. No rash noted. Not diaphoretic. No erythema. No pallor. MUSCULOSKELETAL: Normal range of motion. No tenderness.  No cyanosis, clubbing, or edema.  2+ distal pulses. NEUROLOGIC: Alert and oriented to person, place, and time. Normal reflexes, muscle tone coordination.  PSYCHIATRIC: Normal mood and affect. Normal behavior. Normal judgment and thought content. CARDIOVASCULAR: Normal heart rate noted, regular rhythm RESPIRATORY: Clear to auscultation  bilaterally. Effort and breath sounds normal, no problems with respiration noted. BREASTS: Symmetric in size. No masses, tenderness, skin changes, nipple drainage, or lymphadenopathy bilaterally.  ABDOMEN: Soft, no distention noted.  No tenderness, rebound or guarding.  PELVIC: Normal appearing external genitalia and urethral meatus; normal appearing vaginal mucosa and cervix.  No abnormal discharge noted.  Pap smear not done.  Normal uterine size, no other palpable masses, no uterine or adnexal tenderness.  .   Assessment and Plan:    1. Encounter for well woman exam with routine gynecological exam  Pap: Due 2023 Mammogram : n/a Labs: Hep c, Lipid profile  Refills/orders: flu vaccine Referral: none Routine preventative health maintenance measures emphasized. Please refer to After Visit Summary for other counseling recommendations.      Doreene Burke, CNM [facility],  [facility]

## 2020-12-29 ENCOUNTER — Telehealth: Payer: Self-pay

## 2020-12-29 NOTE — Telephone Encounter (Signed)
Patient called in stating that she needs a letter stating that she is still breastfeeding to [facility]. Patient states we do this for her every month. Patient could not provide me the fax number to [facility]. Informed her I would send a message back to her provider to see what we could do for her.  Could you please advise?

## 2020-12-29 NOTE — Telephone Encounter (Signed)
Your last note does not indicate she is breast feeding. Can you pls confirm? Thanks.

## 2020-12-30 ENCOUNTER — Telehealth: Payer: Self-pay

## 2020-12-30 NOTE — Telephone Encounter (Signed)
No answer. Will send my chart message and letter via my chart.

## 2020-12-30 NOTE — Telephone Encounter (Signed)
Ronna from [facility] called and stated that she needs the letter faxed to there office for the pt. She gave me the fax number and the fax was a success.

## 2020-12-30 NOTE — Telephone Encounter (Signed)
I am not sure what you mean by confirm? If the patient state she is still breast feeding then I would assume she is telling the truth. However, she delivered her baby 08/2019 Her last exam was in December 2021. In all honesty when I do the breast exam I do not express milk to confirm that she is still lactating. Given that she delivered over a year ago , I more than likely did not ask her about breastfeeding.  I have no way to confirm that for the present time??   Thanks,  Pattricia Boss

## 2021-02-23 DIAGNOSIS — G2581 Restless legs syndrome: Secondary | ICD-10-CM | POA: Diagnosis not present

## 2021-03-08 ENCOUNTER — Telehealth: Payer: Self-pay | Admitting: Certified Nurse Midwife

## 2021-03-08 DIAGNOSIS — J069 Acute upper respiratory infection, unspecified: Secondary | ICD-10-CM | POA: Diagnosis not present

## 2021-03-08 DIAGNOSIS — B9689 Other specified bacterial agents as the cause of diseases classified elsewhere: Secondary | ICD-10-CM | POA: Diagnosis not present

## 2021-03-08 DIAGNOSIS — R0602 Shortness of breath: Secondary | ICD-10-CM | POA: Diagnosis not present

## 2021-03-08 NOTE — Telephone Encounter (Signed)
Bethany Ramsey called in and states she needs a letter typed up stating she breastfeeds in order to continue certain medications.  Bethany Ramsey would like the letter faxed to her employer:  [employer] Fax: [phone]  There have been letters written previously.

## 2021-03-08 NOTE — Telephone Encounter (Signed)
Spoke with patient and she stated that she is still breast feeding. She stated that she will come in for visit if she needs to. This is the only way that she can get her medication is to have the letter sent.

## 2021-03-13 ENCOUNTER — Emergency Department: Payer: Medicaid Other

## 2021-03-13 ENCOUNTER — Other Ambulatory Visit: Payer: Self-pay

## 2021-03-13 ENCOUNTER — Emergency Department
Admission: EM | Admit: 2021-03-13 | Discharge: 2021-03-13 | Disposition: A | Discharge status: Home / Self Care | Payer: Medicaid Other | Attending: Emergency Medicine | Admitting: Emergency Medicine

## 2021-03-13 DIAGNOSIS — R0789 Other chest pain: Secondary | ICD-10-CM | POA: Insufficient documentation

## 2021-03-13 DIAGNOSIS — R0600 Dyspnea, unspecified: Secondary | ICD-10-CM | POA: Diagnosis not present

## 2021-03-13 DIAGNOSIS — R0601 Orthopnea: Secondary | ICD-10-CM

## 2021-03-13 DIAGNOSIS — R519 Headache, unspecified: Secondary | ICD-10-CM | POA: Diagnosis not present

## 2021-03-13 DIAGNOSIS — R0602 Shortness of breath: Secondary | ICD-10-CM | POA: Diagnosis not present

## 2021-03-13 DIAGNOSIS — R059 Cough, unspecified: Secondary | ICD-10-CM

## 2021-03-13 DIAGNOSIS — Z87891 Personal history of nicotine dependence: Secondary | ICD-10-CM | POA: Insufficient documentation

## 2021-03-13 LAB — CBC WITH DIFFERENTIAL/PLATELET
Abs Immature Granulocytes: 0.01 10*3/uL (ref 0.00–0.07)
Basophils Absolute: 0 10*3/uL (ref 0.0–0.1)
Basophils Relative: 0 %
Eosinophils Absolute: 0.1 10*3/uL (ref 0.0–0.5)
Eosinophils Relative: 3 %
HCT: 39.9 % (ref 36.0–46.0)
Hemoglobin: 13.2 g/dL (ref 12.0–15.0)
Immature Granulocytes: 0 %
Lymphocytes Relative: 32 %
Lymphs Abs: 1.4 10*3/uL (ref 0.7–4.0)
MCH: 30.1 pg (ref 26.0–34.0)
MCHC: 33.1 g/dL (ref 30.0–36.0)
MCV: 91.1 fL (ref 80.0–100.0)
Monocytes Absolute: 0.3 10*3/uL (ref 0.1–1.0)
Monocytes Relative: 6 %
Neutro Abs: 2.7 10*3/uL (ref 1.7–7.7)
Neutrophils Relative %: 59 %
Platelets: 183 10*3/uL (ref 150–400)
RBC: 4.38 MIL/uL (ref 3.87–5.11)
RDW: 12.2 % (ref 11.5–15.5)
WBC: 4.5 10*3/uL (ref 4.0–10.5)
nRBC: 0 % (ref 0.0–0.2)

## 2021-03-13 LAB — COMPREHENSIVE METABOLIC PANEL
ALT: 18 U/L (ref 0–44)
AST: 17 U/L (ref 15–41)
Albumin: 4.3 g/dL (ref 3.5–5.0)
Alkaline Phosphatase: 59 U/L (ref 38–126)
Anion gap: 7 (ref 5–15)
BUN: 12 mg/dL (ref 6–20)
CO2: 26 mmol/L (ref 22–32)
Calcium: 9.4 mg/dL (ref 8.9–10.3)
Chloride: 105 mmol/L (ref 98–111)
Creatinine, Ser: 0.68 mg/dL (ref 0.44–1.00)
GFR, Estimated: >60 mL/min (ref >60)
Glucose, Bld: 107 mg/dL — ABNORMAL HIGH (ref 70–99)
Potassium: 4.4 mmol/L (ref 3.5–5.1)
Sodium: 138 mmol/L (ref 135–145)
Total Bilirubin: 1.5 mg/dL — ABNORMAL HIGH (ref 0.3–1.2)
Total Protein: 7.1 g/dL (ref 6.5–8.1)

## 2021-03-13 LAB — D-DIMER, QUANTITATIVE: D-Dimer, Quant: <0.27 ug/mL-FEU (ref 0.00–0.50)

## 2021-03-13 MED ORDER — IOHEXOL 350 MG/ML SOLN
100.0000 mL | Freq: Once | INTRAVENOUS | Status: AC | PRN
  Administered 2021-03-13: 100 mL via INTRAVENOUS
  Filled 2021-03-13: qty 100

## 2021-03-13 NOTE — Discharge Instructions (Signed)
You were seen today for headache, cough, shortness of breath and chest tightness.  Your labs were unremarkable.  Your EKG was normal.  Your chest x-ray did not show any evidence of pneumonia.  The CT of your chest did not show any evidence of a blood clot in your lung.  Please continue the albuterol 1 to 2 puffs every 4-6 hours as needed.  Continue to abstain from smoking.  [facility] if symptoms persist or worsen.

## 2021-03-13 NOTE — ED Provider Notes (Signed)
[facility] Emergency Department Provider Note ____________________________________________  Time seen: 1215  I have reviewed the triage vital signs and the nursing notes.  HISTORY  Chief Complaint  Shortness of Breath   HPI Bethany Ramsey is a 30 y.o. female presents to the ER today with complaint of headache, cough, shortness of breath and chest tightness.  She reports this started 2 weeks ago.  The headache is located in her forehead.  She describes the pain as pressure.  The headache is intermittent and not severe.  She denies dizziness or visual changes.  The cough is dry nonproductive.  The shortness of breath is worse with taking a deep breath.  The chest tightness seems consistent.  She denies runny nose, nasal congestion, ear pain, sore throat, nausea, vomiting or diarrhea.  She was seen at [facility] urgent care 1 week ago for the same.  Chest x-ray at that time was normal.  COVID test was negative.  She was given a nebulizer treatment and a Rx for Albuterol which she reports helps for a short period of time then symptoms persist.  She is concerned that this is related to vaping which she stopped 1 week ago.  She reports she has vaped for the last 2 years, smoked cigarettes for 10 years prior.  She has not had sick contacts or exposure to COVID that she is aware of.  She has not taken anything OTC for symptoms.  Past Medical History:  Diagnosis Date  . Drug abuse and dependence (HCC)   . Miscarriage   . Obesity (BMI 30-39.9)   . Preeclampsia    when pregnant with son 2013    Patient Active Problem List   Diagnosis Date Noted  . Labor and delivery, indication for care 08/20/2019  . Anemia affecting pregnancy 05/30/2019  . Suboxone maintenance treatment complicating pregnancy, antepartum (HCC) 05/06/2019  . Smoker 05/06/2019  . History of substance abuse (HCC) 05/06/2019  . Marijuana user 04/29/2019    Past Surgical History:  Procedure Laterality Date  .  TUBAL LIGATION Bilateral 08/21/2019   Procedure: POST PARTUM TUBAL LIGATION;  Surgeon: Hildred Laser, MD;  Location: [facility] ORS;  Service: Gynecology;  Laterality: Bilateral;    Prior to Admission medications   Medication Sig Start Date End Date Taking? Authorizing Provider  ARIPiprazole (ABILIFY) 2 MG tablet Take 2 mg by mouth daily.    [provider]  buprenorphine (SUBUTEX) 8 MG SUBL SL tablet Place 8 mg under the tongue 2 (two) times daily.    [provider]  Cholecalciferol (VITAMIN D3) 125 MCG (5000 UT) CAPS Take 1 capsule (5,000 Units total) by mouth daily. Patient not taking: Reported on 11/09/2020 08/22/19   Purcell Nails, CNM  Prenatal Vit-Fe Fumarate-FA (PRENATAL MULTIVITAMIN) TABS tablet Take 1 tablet by mouth daily at 12 noon.    [provider]    Allergies Patient has no known allergies.  Family History  Problem Relation Age of Onset  . Stroke Maternal Grandmother   . Stroke Maternal Grandfather   . Stroke Sister     Social History Social History   Tobacco Use  . Smoking status: Former Smoker    Packs/day: 1.00    Types: Cigarettes    Quit date: 08/06/2019    Years since quitting: 1.6  . Smokeless tobacco: Never Used  Vaping Use  . Vaping Use: Never used  Substance Use Topics  . Alcohol use: No  . Drug use: Not Currently    Types: Marijuana  Review of Systems  Constitutional: Negative for fever, chills or body aches. Eyes: Negative for visual changes, eye pain, eye redness or discharge. ENT: Negative for runny nose, nasal congestion, ear pain or sore throat. Cardiovascular: Positive for chest tightness.  Negative for chest pain. Respiratory: Positive for cough and shortness of breath.  Gastrointestinal: Negative for abdominal pain, nausea, vomiting, constipation or diarrhea. Genitourinary: Negative for urinary urgency, frequency, dysuria or blood in her urine. Musculoskeletal: Negative for back pain. Skin: Negative for  rash. Neurological: Positive for headache.  Negative for dizziness, focal weakness, tingling or numbness. ____________________________________________  PHYSICAL EXAM:  VITAL SIGNS: ED Triage Vitals  Enc Vitals Group     BP 03/13/21 1127 (!) 151/90     Pulse Rate 03/13/21 1131 67     Resp 03/13/21 1127 16     Temp 03/13/21 1127 98.6 F (37 C)     Temp Source 03/13/21 1127 Oral     SpO2 03/13/21 1127 97 %     Weight 03/13/21 1128 242 lb (109.8 kg)     Height 03/13/21 1128 5\' 9"  (1.753 m)     Head Circumference --      Peak Flow --      Pain Score 03/13/21 1128 6     Pain Loc --      Pain Edu? --      Excl. in GC? --     Constitutional: Alert and oriented.  Obese, in no distress. Head: Normocephalic. Eyes: Sclera white.  Normal extraocular movements Hematological/Lymphatic/Immunological: No cervical lymphadenopathy. Cardiovascular: Normal rate, regular rhythm.  Respiratory: Normal respiratory effort. No wheezes/rales/rhonchi noted. Musculoskeletal: No difficulty with gait. Neurologic:  Normal gait without ataxia. Normal speech and language. No gross focal neurologic deficits are appreciated. Skin:  Skin is warm, dry and intact. No rash noted. Psychiatric: Mildly anxious appearing. ____________________________________________   LABS Labs Reviewed  COMPREHENSIVE METABOLIC PANEL - Abnormal; Notable for the following components:      Result Value   Glucose, Bld 107 (*)    Total Bilirubin 1.5 (*)    All other components within normal limits  CBC WITH DIFFERENTIAL/PLATELET  D-DIMER, QUANTITATIVE    ____________________________________________  EKG ED ECG REPORT   Date: 03/13/2021  EKG Time: 2:57 PM  Rate: 57  Rhythm: Bradycardic, slower rate but rhythm unchanged from prior.  Axis: normal  Intervals:no conduction defects  ST&T Change: no ST changes  Narrative Interpretation: Bradycardic with normal rhythm. No acute  findings.  ____________________________________________   RADIOLOGY  Imaging Orders     DG Chest 2 View     CT Angio Chest PE W and/or Wo Contrast IMPRESSION: No active cardiopulmonary disease.  IMPRESSION: No evidence of acute pulmonary embolism or other acute abnormality.  Hiatal hernia.  ____________________________________________    INITIAL IMPRESSION / ASSESSMENT AND PLAN / ED COURSE  Acute Headache, Cough, Dyspnea, Chest Tightness:  DDx include smokers cough, pneumonia, acute bronchitis, new onset asthma, pulmonary embolism CBC unremarkable CMET unremarkable D dimer negative ECG shows no acute findings Chest xray negative for pneumonia CTA chest negative for PE Advised her to continue Albuterol as previously prescribed Given negative workup, she is agreeable to d/c Will have her [facility] ____________________________________________  FINAL CLINICAL IMPRESSION(S) / ED DIAGNOSES  Final diagnoses:  Acute intractable headache, unspecified headache type  Cough  Orthopnea      03/15/2021, NP 03/13/21 1457    03/15/21, MD 03/15/21 [phone]

## 2021-03-13 NOTE — ED Triage Notes (Signed)
Pt to ER via POV with complaints of shortness of breath x2 weeks. Pt reports pain/ discomfort when trying to breathe, most noticable with exertion. States she just quit vaping. [facility] and prescribed an abx with no relief in symptoms.

## 2021-03-24 DIAGNOSIS — R0789 Other chest pain: Secondary | ICD-10-CM | POA: Diagnosis not present

## 2021-05-25 ENCOUNTER — Encounter: Payer: Self-pay | Admitting: Family Medicine

## 2021-05-25 ENCOUNTER — Other Ambulatory Visit: Payer: Self-pay

## 2021-05-25 ENCOUNTER — Ambulatory Visit: Payer: Medicaid Other | Admitting: Family Medicine

## 2021-05-25 VITALS — BP 116/46 | HR 78 | Ht 69.0 in | Wt 237.2 lb

## 2021-05-25 DIAGNOSIS — Z7689 Persons encountering health services in other specified circumstances: Secondary | ICD-10-CM | POA: Diagnosis not present

## 2021-05-25 DIAGNOSIS — F319 Bipolar disorder, unspecified: Secondary | ICD-10-CM | POA: Insufficient documentation

## 2021-05-25 DIAGNOSIS — F1120 Opioid dependence, uncomplicated: Secondary | ICD-10-CM

## 2021-05-25 DIAGNOSIS — F3175 Bipolar disorder, in partial remission, most recent episode depressed: Secondary | ICD-10-CM

## 2021-05-25 DIAGNOSIS — L409 Psoriasis, unspecified: Secondary | ICD-10-CM | POA: Diagnosis not present

## 2021-05-25 MED ORDER — CLOBETASOL PROPIONATE 0.05 % EX SOLN: 1.0000 "application " | Freq: Two times a day (BID) | CUTANEOUS | 2 refills | Status: DC | PRN

## 2021-05-25 NOTE — Progress Notes (Signed)
Subjective:    Patient ID: Bethany Ramsey, female    DOB: 10/29/1991, 30 y.o.   MRN: [id number]  Bethany Ramsey is a 30 y.o. female presenting on 05/25/2021 for Establish Care and Psoriasis  No prior PCP.   HPI  Bipolar, unspecified Opiate Dependence, on agonist therapy Followed by [facility] Stabilizer medications - was on initially Abilify, then switched to Lamotrigine, she is waiting refill pending, has upcoming apt. Overall she is doing well with mood  She reports background history of recreational use of opiates years prior, not prescribed for any medical condition or pain. then due to pregnancy, switched to Subutex during the pregnancy, then to Suboxone. She is working on tapering down. With [employer]. - Primarily mom and sister support system  Psoriasis, scalp Reports noticed some mild symptoms while pregnant back 2 years ago, now in past 1.5 years worsening symptoms with irritated, itching, flaky CeraVe Psoriasis with some relief on face She also uses shampoo to help relieve itchiness Admits family history of sister and paternal grandmother  Elevated BP without HTN in Past Obesity BMI >35   Followed by [facility] for yearly well women exam 10/2020  Vaping Related Symptoms Had chest pain and some dyspnea or orthpnea at times. She was smoking cigarettes years past, and then switched to vaping 07/2019 and ultimately vaped for about 2 years then had some abnormal symptoms with chest discomfort / orthopnea and later seen by Pulmonology [facility] and had CT chest unable to find cause, she quit vaping since 02/2021 and symptoms have resolved.   Health Maintenance:  Due for COVID 2nd dose when ready.  Depression screen [facility] 2/9 05/25/2021  Decreased Interest 0  Down, Depressed, Hopeless 0  PHQ - 2 Score 0  Altered sleeping 0  Tired, decreased energy 0  Change in appetite 0  Feeling bad or failure about yourself  0  Trouble concentrating 0  Moving slowly or  fidgety/restless 0  Suicidal thoughts 0  PHQ-9 Score 0  Difficult doing work/chores Not difficult at all   GAD 7 : Generalized Anxiety Score 05/25/2021  Nervous, Anxious, on Edge 0  Control/stop worrying 0  Worry too much - different things 0  Trouble relaxing 0  Restless 0  Easily annoyed or irritable 0  Afraid - awful might happen 0  Total GAD 7 Score 0  Anxiety Difficulty Not difficult at all    Past Medical History:  Diagnosis Date   Drug abuse and dependence (HCC)    Miscarriage    Obesity (BMI 30-39.9)    Preeclampsia    when pregnant with son 2013   Past Surgical History:  Procedure Laterality Date   TUBAL LIGATION Bilateral 08/21/2019   Procedure: POST PARTUM TUBAL LIGATION;  Surgeon: Hildred Laser, MD;  Location: [facility] ORS;  Service: Gynecology;  Laterality: Bilateral;   Social History   Socioeconomic History   Marital status: Single    Spouse name: Not on file   Number of children: Not on file   Years of education: Not on file   Highest education level: Not on file  Occupational History   Not on file  Tobacco Use   Smoking status: Former    Packs/day: 0.50    Years: 8.00    Pack years: 4.00    Types: Cigarettes    Quit date: 08/06/2019    Years since quitting: 1.8   Smokeless tobacco: Never  Vaping Use   Vaping Use: Former   Start date: 08/06/2019  Quit date: 03/13/2021  Substance and Sexual Activity   Alcohol use: No   Drug use: Not Currently    Types: Marijuana   Sexual activity: Not Currently  Other Topics Concern   Not on file  Social History Narrative   Not on file   Social Determinants of Health   Financial Resource Strain: Not on file  Food Insecurity: Not on file  Transportation Needs: Not on file  Physical Activity: Not on file  Stress: Not on file  Social Connections: Not on file  Intimate Partner Violence: Not on file   Family History  Problem Relation Age of Onset   Psoriasis Sister    Hypertension Maternal Grandmother     Stroke Maternal Grandmother    Psoriasis Paternal Grandfather    Current Outpatient Medications on File Prior to Visit  Medication Sig   lamoTRIgine (LAMICTAL) 25 MG tablet Take 25 mg by mouth 2 (two) times daily.   SUBOXONE 8-2 MG FILM Place under the tongue daily.   Cholecalciferol (VITAMIN D3) 125 MCG (5000 UT) CAPS Take 1 capsule (5,000 Units total) by mouth daily. (Patient not taking: No sig reported)   No current facility-administered medications on file prior to visit.    Review of Systems Per HPI unless specifically indicated above      Objective:    BP (!) 116/46   Pulse 78   Ht 5\' 9"  (1.753 m)   Wt 237 lb 3.2 oz (107.6 kg)   SpO2 99%   BMI 35.03 kg/m   Wt Readings from Last 3 Encounters:  05/25/21 237 lb 3.2 oz (107.6 kg)  03/13/21 242 lb (109.8 kg)  11/09/20 244 lb 9.6 oz (110.9 kg)    Physical Exam Vitals and nursing note reviewed.  Constitutional:      General: She is not in acute distress.    Appearance: Normal appearance. She is well-developed. She is not diaphoretic.     Comments: Well-appearing, comfortable, cooperative  HENT:     Head: Normocephalic and atraumatic.  Eyes:     General:        Right eye: No discharge.        Left eye: No discharge.     Conjunctiva/sclera: Conjunctivae normal.  Cardiovascular:     Rate and Rhythm: Normal rate.  Pulmonary:     Effort: Pulmonary effort is normal.  Skin:    General: Skin is warm and dry.     Findings: Rash (scalp erythematous patchy flaky skin rash multiple areas) present. No erythema.  Neurological:     Mental Status: She is alert and oriented to person, place, and time.  Psychiatric:        Mood and Affect: Mood normal.        Behavior: Behavior normal.        Thought Content: Thought content normal.     Comments: Well groomed, good eye contact, normal speech and thoughts    I have personally reviewed the radiology report from CT Chest on 03/13/21.  EXAM:  CT ANGIOGRAPHY CHEST WITH CONTRAST    TECHNIQUE:  Multidetector CT imaging of the chest was performed using the  standard protocol during bolus administration of intravenous  contrast. Multiplanar CT image reconstructions and MIPs were  obtained to evaluate the vascular anatomy.   CONTRAST:  03/15/21 OMNIPAQUE IOHEXOL 350 MG/ML SOLN   COMPARISON:  None.   FINDINGS:  Cardiovascular: Satisfactory opacification of the pulmonary arteries  to the segmental level. No evidence of pulmonary embolism. Normal  heart size. No pericardial effusion.   Mediastinum/Nodes: No enlarged lymph nodes. Visualized thyroid is  unremarkable. Esophagus is unremarkable.   Lungs/Pleura: No pleural effusion or pneumothorax.   Upper Abdomen: No acute abnormality.  Small hiatal hernia.   Musculoskeletal: No acute osseous abnormality.   Review of the MIP images confirms the above findings.   IMPRESSION:  No evidence of acute pulmonary embolism or other acute abnormality.   Hiatal hernia.   Electronically Signed    By: Guadlupe Spanish M.D.    On: 03/13/2021 14:28  Results for orders placed or performed during the hospital encounter of 03/13/21  CBC with Differential  Result Value Ref Range   WBC 4.5 4.0 - 10.5 K/uL   RBC 4.38 3.87 - 5.11 MIL/uL   Hemoglobin 13.2 12.0 - 15.0 g/dL   HCT 50.0 93.8 - 18.2 %   MCV 91.1 80.0 - 100.0 fL   MCH 30.1 26.0 - 34.0 pg   MCHC 33.1 30.0 - 36.0 g/dL   RDW 99.3 71.6 - 96.7 %   Platelets 183 150 - 400 K/uL   nRBC 0.0 0.0 - 0.2 %   Neutrophils Relative % 59 %   Neutro Abs 2.7 1.7 - 7.7 K/uL   Lymphocytes Relative 32 %   Lymphs Abs 1.4 0.7 - 4.0 K/uL   Monocytes Relative 6 %   Monocytes Absolute 0.3 0.1 - 1.0 K/uL   Eosinophils Relative 3 %   Eosinophils Absolute 0.1 0.0 - 0.5 K/uL   Basophils Relative 0 %   Basophils Absolute 0.0 0.0 - 0.1 K/uL   Immature Granulocytes 0 %   Abs Immature Granulocytes 0.01 0.00 - 0.07 K/uL  Comprehensive metabolic panel  Result Value Ref Range   Sodium 138 135  - 145 mmol/L   Potassium 4.4 3.5 - 5.1 mmol/L   Chloride 105 98 - 111 mmol/L   CO2 26 22 - 32 mmol/L   Glucose, Bld 107 (H) 70 - 99 mg/dL   BUN 12 6 - 20 mg/dL   Creatinine, Ser 8.93 0.44 - 1.00 mg/dL   Calcium 9.4 8.9 - 81.0 mg/dL   Total Protein 7.1 6.5 - 8.1 g/dL   Albumin 4.3 3.5 - 5.0 g/dL   AST 17 15 - 41 U/L   ALT 18 0 - 44 U/L   Alkaline Phosphatase 59 38 - 126 U/L   Total Bilirubin 1.5 (H) 0.3 - 1.2 mg/dL   GFR, Estimated >17 >51 mL/min   Anion gap 7 5 - 15  D-dimer, quantitative  Result Value Ref Range   D-Dimer, Quant <0.27 0.00 - 0.50 ug/mL-FEU      Assessment & Plan:   Problem List Items Addressed This Visit     Psoriasis of scalp - Primary   Relevant Medications   clobetasol (TEMOVATE) 0.05 % external solution   Opiate dependence (HCC)   Bipolar disorder, unspecified (HCC)   Other Visit Diagnoses     Encounter to establish care with new doctor           Clinically with psoriasis, localized skin involvement scalp No other areas No significant history psoriatic arthritis  Using OTC regimen with some relief  Will trial rx topical steroid Clobetasol ext scalp solution BID application for flare only 1-2 weeks Continue current shampoo, topical  May refer to Dermatology when/if ready for further management.  #Opiate Dependence On Suboxone Bipolar unspecified - controlled Chronic problems managed by [facility] Discussed today that she should continue w/ them for  mixture of substance/mood issues, and this is not something that I would manage here as we cannot order suboxone or some bipolar management. She will request copy of next visit records to be faxed to us for review and updates as well.   Meds ordered this encounter  Medications   clobetasol (TEMOVATE) 0.05 % external solution    Sig: Apply 1 application topically 2 (two) times daily as needed (scalp itching psoriasis flare).    Dispense:  50 mL    Refill:  2      Follow up  plan: Return in about 6 months (around 11/25/2021) for 6 month follow-up Psoriasis / Mood - AM apt preferred fasting - maybe blood draw.  Saralyn PilarAlexander Demetrick Eichenberger, DO [facility] 05/25/2021, 3:56 PM

## 2021-05-25 NOTE — Patient Instructions (Addendum)
Thank you for coming to the office today.  Use the topical scalp solution for flare up, twice a day, can still use the same shampoo and topical  Have [facility] send [place] copy of report  If need we can refer   [facility] [place]ologist in [place] Address: [address], [place], [place] [address] Phone: [phone]  [facility]   [address] [place], [place] [address] Hours: 8AM-5PM Phone: [phone]  [facility] [address] [place], [place] [address] Phone: [phone]   Please schedule a Follow-up Appointment to: Return in about 6 months (around 11/25/2021) for 6 month follow-up Psoriasis / Mood - AM apt preferred fasting - maybe blood draw.  If you have any other questions or concerns, please feel free to call the office or send a message through MyChart. You may also schedule an earlier appointment if necessary.  Additionally, you may be receiving a survey about your experience at our office within a few days to 1 week by e-mail or mail. We value your feedback.  Saralyn Pilar, DO [facility], [place]

## 2021-07-24 IMAGING — CR DG CHEST 2V
1 series · 2 of 2 positions shown · non-contrast
Comparison: March 11, 2016

CLINICAL DATA: Short breath

EXAM:
CHEST - 2 VIEW

[Series 1: dg chest 2 view · 0.14mm/px · 2 of 2 slices shown]
[im 1/2]
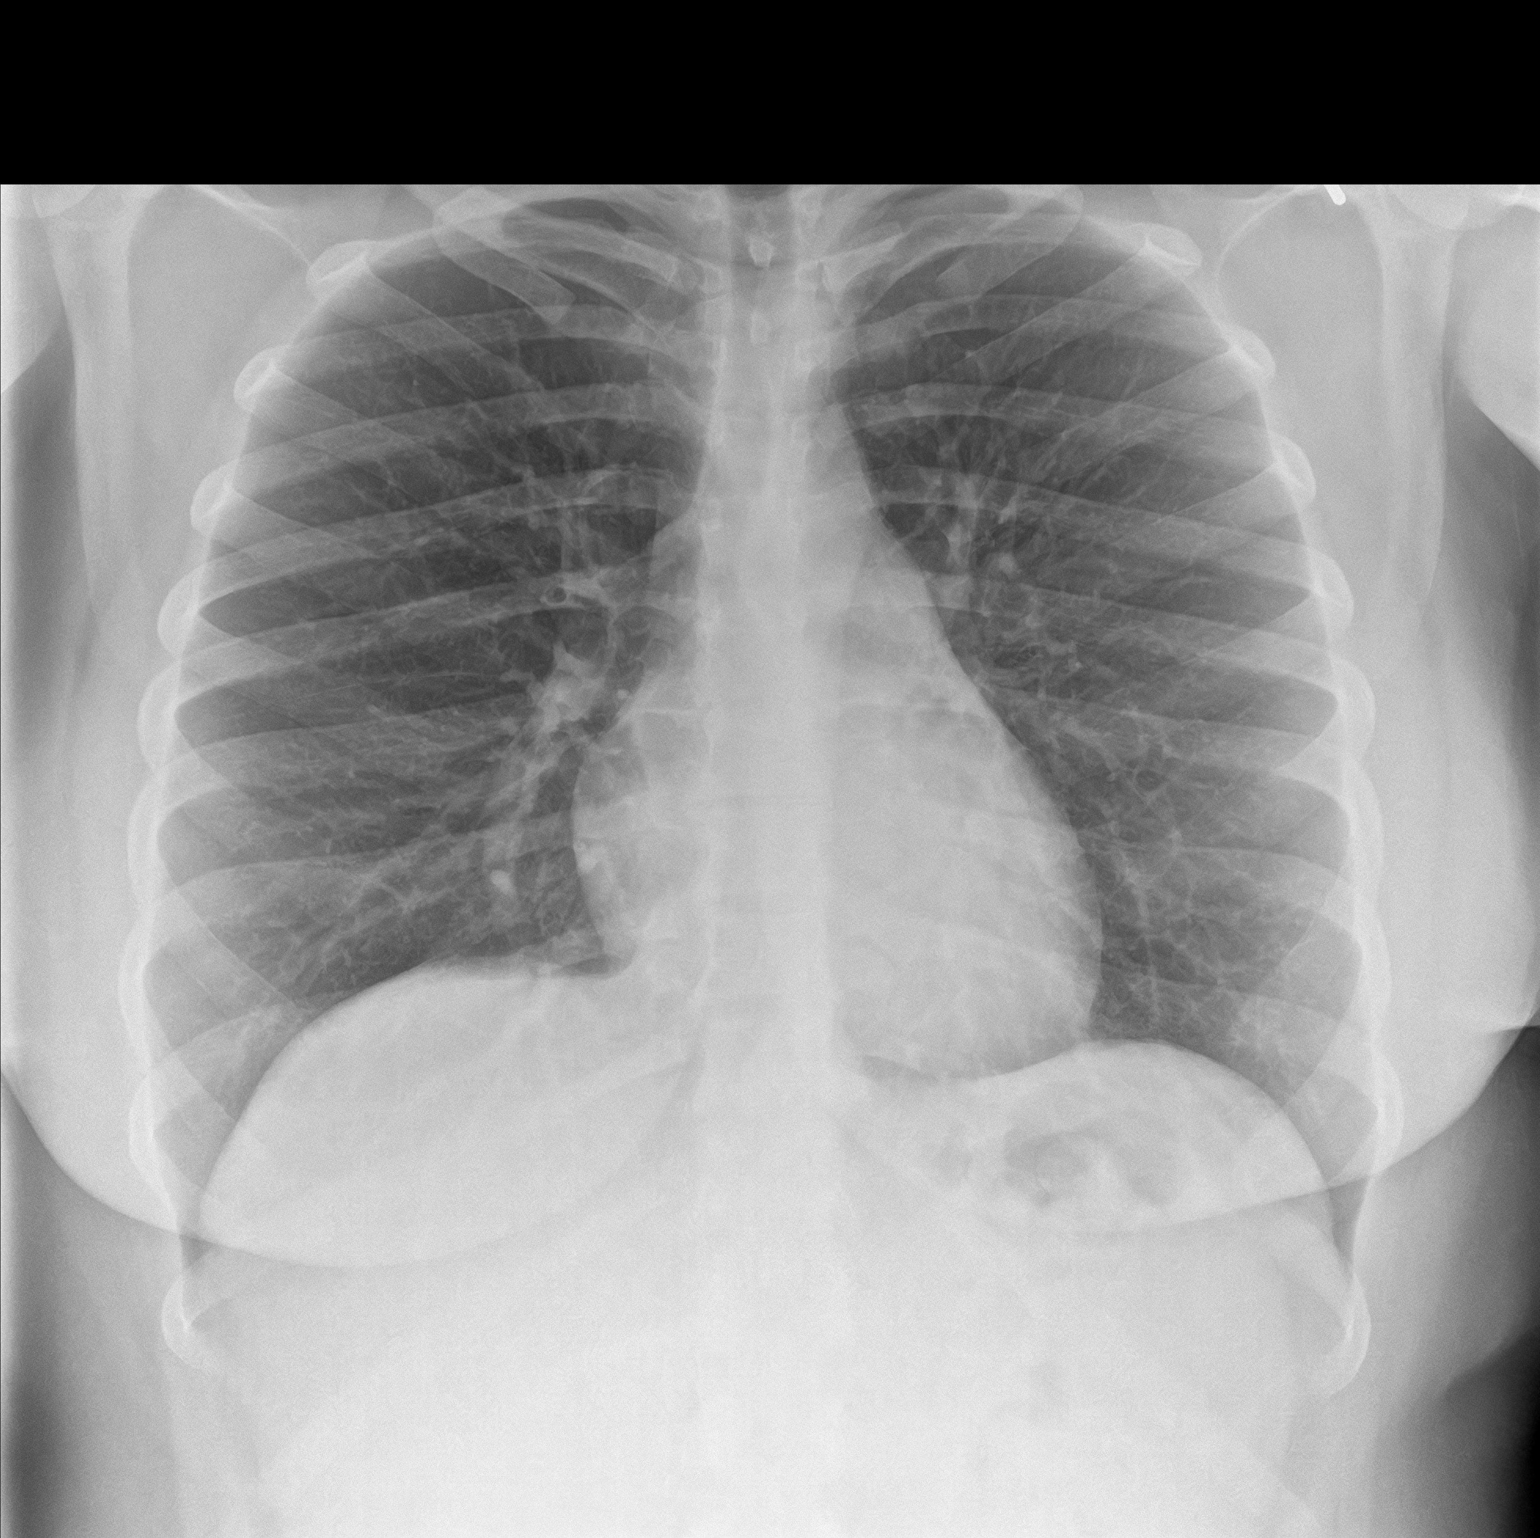
[im 2/2]
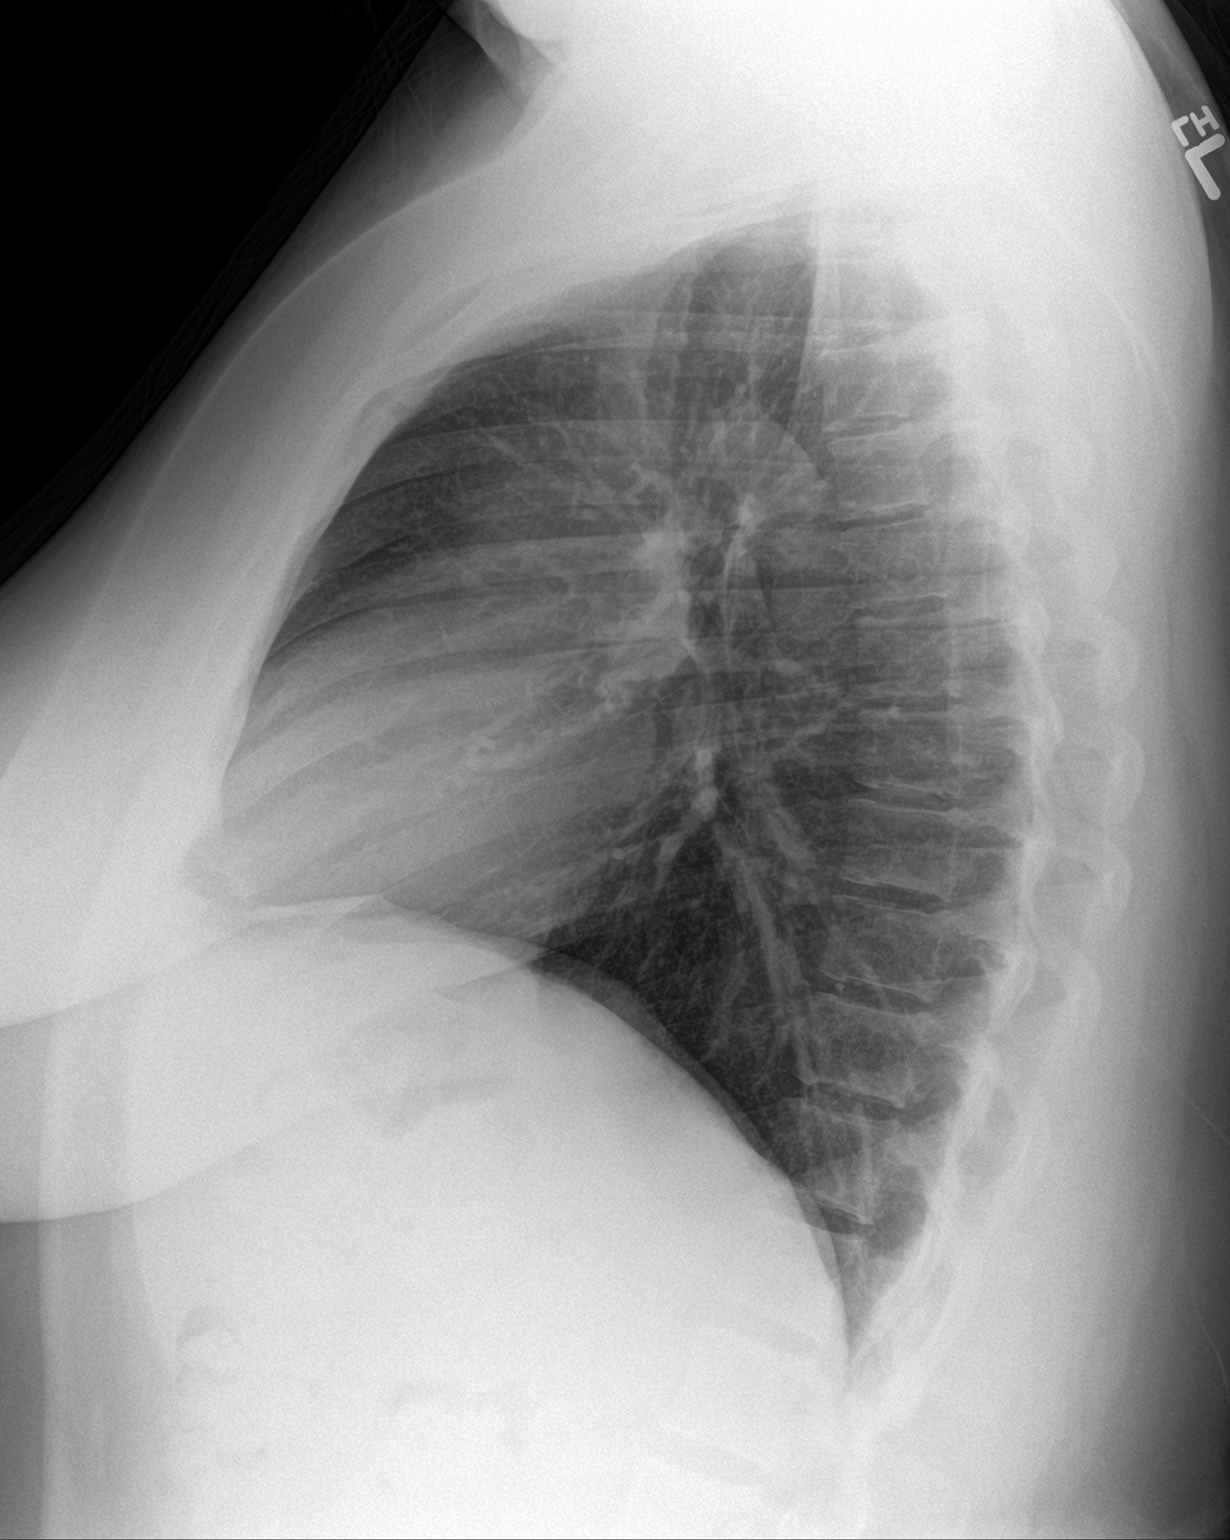

[2 of 2 positions shown; findings below may reference images not displayed]

FINDINGS: The heart size and mediastinal contours are within normal limits.
Both lungs are clear. The visualized skeletal structures are
unremarkable.
IMPRESSION: No active cardiopulmonary disease.

## 2021-10-14 DIAGNOSIS — Z419 Encounter for procedure for purposes other than remedying health state, unspecified: Secondary | ICD-10-CM | POA: Diagnosis not present

## 2021-11-14 DIAGNOSIS — Z419 Encounter for procedure for purposes other than remedying health state, unspecified: Secondary | ICD-10-CM | POA: Diagnosis not present

## 2021-11-17 ENCOUNTER — Encounter: Payer: Medicaid Other | Admitting: Certified Nurse Midwife

## 2021-12-08 ENCOUNTER — Ambulatory Visit: Payer: Self-pay

## 2021-12-08 DIAGNOSIS — Z03818 Encounter for observation for suspected exposure to other biological agents ruled out: Secondary | ICD-10-CM | POA: Diagnosis not present

## 2021-12-08 DIAGNOSIS — Z20822 Contact with and (suspected) exposure to covid-19: Secondary | ICD-10-CM | POA: Diagnosis not present

## 2021-12-08 NOTE — Telephone Encounter (Signed)
Summary: cold symptoms   Pt has chills, body aches, headache, cough and runny nose / pt asked to see someone today / no appts open until Friday / advised of mychart visits/ pt wants in office appt /please advise      Chief Complaint: Cough, sinus pain, sore throat,chills Symptoms: Above Frequency: Started last week Pertinent Negatives: Patient denies fever Disposition: [] ED /[] Urgent Care (no appt availability in office) / [x] Appointment(In office/virtual)/ []  [facility]/ [] Home Care/ [] Refused Recommended Disposition /[] [facility]/ []  Follow-up with PCP Additional Notes: Going to [facility] today for COVID/Flu testing.  Reason for Disposition  [1] Continuous (nonstop) coughing interferes with work or school AND [2] no improvement using cough treatment per Care Advice  Answer Assessment - Initial Assessment Questions 1. ONSET: "When did the cough begin?"      Last week 2. SEVERITY: "How bad is the cough today?"      Moderate 3. SPUTUM: "Describe the color of your sputum" (none, dry cough; clear, white, yellow, green)     Light green 4. HEMOPTYSIS: "Are you coughing up any blood?" If so ask: "How much?" (flecks, streaks, tablespoons, etc.)     No 5. DIFFICULTY BREATHING: "Are you having difficulty breathing?" If Yes, ask: "How bad is it?" (e.g., mild, moderate, severe)    - MILD: No SOB at rest, mild SOB with walking, speaks normally in sentences, can lie down, no retractions, pulse < 100.    - MODERATE: SOB at rest, SOB with minimal exertion and prefers to sit, cannot lie down flat, speaks in phrases, mild retractions, audible wheezing, pulse 100-120.    - SEVERE: Very SOB at rest, speaks in single words, struggling to breathe, sitting hunched forward, retractions, pulse > 120      Mild 6. FEVER: "Do you have a fever?" If Yes, ask: "What is your temperature, how was it measured, and when did it start?"     Chills 7. CARDIAC HISTORY: "Do you have any  history of heart disease?" (e.g., heart attack, congestive heart failure)      No 8. LUNG HISTORY: "Do you have any history of lung disease?"  (e.g., pulmonary embolus, asthma, emphysema)     No 9. PE RISK FACTORS: "Do you have a history of blood clots?" (or: recent major surgery, recent prolonged travel, bedridden)     No 10. OTHER SYMPTOMS: "Do you have any other symptoms?" (e.g., runny nose, wheezing, chest pain)       Runny  nose, sore throat 11. PREGNANCY: "Is there any chance you are pregnant?" "When was your last menstrual period?"       No 12. TRAVEL: "Have you traveled out of the country in the last month?" (e.g., travel history, exposures)       No  Protocols used: Cough - Acute Productive-A-AH

## 2021-12-09 ENCOUNTER — Ambulatory Visit (INDEPENDENT_AMBULATORY_CARE_PROVIDER_SITE_OTHER): Payer: Medicaid Other | Admitting: Family Medicine

## 2021-12-09 ENCOUNTER — Encounter: Payer: Self-pay | Admitting: Family Medicine

## 2021-12-09 ENCOUNTER — Other Ambulatory Visit: Payer: Self-pay

## 2021-12-09 VITALS — Ht 69.0 in | Wt 237.0 lb

## 2021-12-09 DIAGNOSIS — J0100 Acute maxillary sinusitis, unspecified: Secondary | ICD-10-CM

## 2021-12-09 DIAGNOSIS — B349 Viral infection, unspecified: Secondary | ICD-10-CM | POA: Diagnosis not present

## 2021-12-09 MED ORDER — AMOXICILLIN-POT CLAVULANATE 875-125 MG PO TABS: 1.0000 | ORAL_TABLET | Freq: Two times a day (BID) | ORAL | 0 refills | Status: DC

## 2021-12-09 MED ORDER — FLUTICASONE PROPIONATE 50 MCG/ACT NA SUSP: 2.0000 | Freq: Every day | NASAL | 3 refills | Status: DC

## 2021-12-09 MED ORDER — PREDNISONE 20 MG PO TABS: ORAL_TABLET | ORAL | 0 refills | Status: DC

## 2021-12-09 NOTE — Patient Instructions (Addendum)
Cannot rule out covid or flu but since last test negative and duration >5 days already.   1. It sounds like you have a Sinusitis (Bacterial Infection) - this most likely started as an Upper Respiratory Virus that has settled into an infection. Allergies can also cause this. - Start Augmentin 1 pill twice daily (breakfast and dinner, with food and plenty of water) for 10 days, complete entire course, do not stop early even if feeling better - Start Prednisone taper 7 days HOLD OFF on Ibuprofen Advil Aleve while on Prednisone  - Start nasal steroid Flonase 2 sprays in each nostril daily for 4-6 weeks, may repeat course seasonally or as needed  Improve hydration by drinking plenty of clear fluids (water, gatorade) to reduce secretions and thin congestion - Congestion draining down throat can cause irritation. May try warm herbal tea with honey, cough drops  Recommend to start taking Tylenol Extra Strength 500mg  tabs - take 1 to 2 tabs per dose (max 1000mg ) every 6-8 hours for pain (take regularly, don't skip a dose for next 7 days), max 24 hour daily dose is 6 tablets or 3000mg . In the future you can repeat the same everyday Tylenol course for 1-2 weeks at a time.   - May continue over the counter cold medicine as you are, I would not use any decongestant or mucinex longer than 7 days.  If you develop persistent fever >101F for at least 3 consecutive days, headaches with sinus pain or pressure or persistent earache, please schedule a follow-up evaluation within next few days to week.  Please schedule a Follow-up Appointment to: Return if symptoms worsen or fail to improve.  If you have any other questions or concerns, please feel free to call the office or send a message through MyChart. You may also schedule an earlier appointment if necessary.  Additionally, you may be receiving a survey about your experience at our office within a few days to 1 week by e-mail or mail. We value your  feedback.  , DO [facility], 

## 2021-12-09 NOTE — Progress Notes (Signed)
Virtual Visit via Telephone The purpose of this virtual visit is to provide medical care while limiting exposure to the novel coronavirus (COVID19) for both patient and office staff.  Consent was obtained for phone visit:  Yes.   Answered questions that patient had about telehealth interaction:  Yes.   I discussed the limitations, risks, security and privacy concerns of performing an evaluation and management service by telephone. I also discussed with the patient that there may be a patient responsible charge related to this service. The patient expressed understanding and agreed to proceed.  Patient Location: Car (parking lot) - she came in to office and had fever and acute symptoms, she agreed to step back out to car to do virtual visit. Provider Location: [facility] (Office)  Participants in virtual visit: - Patient: Bethany Ramsey - CMA: Burnell Blanks, CMA - Provider: Dr Althea Charon  ---------------------------------------------------------------------- Chief Complaint  Patient presents with   Cough   Facial Pain   Headache    S: Reviewed CMA documentation. I have called patient and gathered additional HPI as follows:  Acute Viral Syndrome, Flu-like vs COVID symptoms Reports that symptoms started 11/28/21 with initial cold symptoms that were manageable, she had cough cold congestion, then had second sickening this past weekend on 1/22 worsening body aches fever chills. Sick contact 17 year old daughter sick with similar symptoms also COVID Flu testing negative. [facility] COVID swab test, negative. Also tested for Flu yesterday but it was inconclusive - Tried OTC Mucinex off brand Admits poor taste and smell  Has not had COVID before. She had 1 COVID vaccine x 1 but did not get booster. Did not take Flu Shot this season.  Denies any known or suspected exposure to person with or possibly with COVID19.  Admits fever chills sweats body ache sinus pressure  congestion cough Denies any shortness of breath abdominal pain diarrhea nausea vomiting   Past Medical History:  Diagnosis Date   Drug abuse and dependence (HCC)    Miscarriage    Obesity (BMI 30-39.9)    Preeclampsia    when pregnant with son 2013   Social History   Tobacco Use   Smoking status: Former    Packs/day: 0.50    Years: 8.00    Pack years: 4.00    Types: Cigarettes    Quit date: 08/06/2019    Years since quitting: 2.3   Smokeless tobacco: Never  Vaping Use   Vaping Use: Former   Start date: 08/06/2019   Quit date: 03/13/2021  Substance Use Topics   Alcohol use: No   Drug use: Not Currently    Types: Marijuana    Current Outpatient Medications:    amoxicillin-clavulanate (AUGMENTIN) 875-125 MG tablet, Take 1 tablet by mouth 2 (two) times daily., Disp: 20 tablet, Rfl: 0   clobetasol (TEMOVATE) 0.05 % external solution, Apply 1 application topically 2 (two) times daily as needed (scalp itching psoriasis flare)., Disp: 50 mL, Rfl: 2   fluticasone (FLONASE) 50 MCG/ACT nasal spray, Place 2 sprays into both nostrils daily. Use for 4-6 weeks then stop and use seasonally or as needed., Disp: 16 g, Rfl: 3   lamoTRIgine (LAMICTAL) 25 MG tablet, Take 25 mg by mouth 2 (two) times daily., Disp: , Rfl:    predniSONE (DELTASONE) 20 MG tablet, Take daily with food. Start with 60mg  (3 pills) x 2 days, then reduce to 40mg  (2 pills) x 2 days, then 20mg  (1 pill) x 3 days, Disp: 13 tablet,  Rfl: 0   SUBOXONE 8-2 MG FILM, Place under the tongue daily., Disp: , Rfl:    Cholecalciferol (VITAMIN D3) 125 MCG (5000 UT) CAPS, Take 1 capsule (5,000 Units total) by mouth daily. (Patient not taking: Reported on 11/09/2020), Disp: 120 capsule, Rfl: 2  Depression screen [facility] 2/9 05/25/2021  Decreased Interest 0  Down, Depressed, Hopeless 0  PHQ - 2 Score 0  Altered sleeping 0  Tired, decreased energy 0  Change in appetite 0  Feeling bad or failure about yourself  0  Trouble concentrating 0   Moving slowly or fidgety/restless 0  Suicidal thoughts 0  PHQ-9 Score 0  Difficult doing work/chores Not difficult at all    GAD 7 : Generalized Anxiety Score 05/25/2021  Nervous, Anxious, on Edge 0  Control/stop worrying 0  Worry too much - different things 0  Trouble relaxing 0  Restless 0  Easily annoyed or irritable 0  Afraid - awful might happen 0  Total GAD 7 Score 0  Anxiety Difficulty Not difficult at all    -------------------------------------------------------------------------- O: No physical exam performed due to remote telephone encounter.  Lab results reviewed.  No results found for this or any previous visit (from the past 2160 hour(s)).  -------------------------------------------------------------------------- A&P:  Problem List Items Addressed This Visit   None Visit Diagnoses     Acute non-recurrent maxillary sinusitis    -  Primary   Relevant Medications   predniSONE (DELTASONE) 20 MG tablet   fluticasone (FLONASE) 50 MCG/ACT nasal spray   amoxicillin-clavulanate (AUGMENTIN) 875-125 MG tablet   Acute viral syndrome       Relevant Medications   predniSONE (DELTASONE) 20 MG tablet   fluticasone (FLONASE) 50 MCG/ACT nasal spray   amoxicillin-clavulanate (AUGMENTIN) 875-125 MG tablet      Initial acute viral syndrome vs cold vs flu/covid, testing negative Second sickening now, duration >10 days Empiric coverage for sinusitis and treat underlying symptoms, duration too long for anti covid viral or flu meds, and testing thus far negative.  Start Augmentin 1 pill twice daily (breakfast and dinner, with food and plenty of water) for 10 days, complete entire course, do not stop early even if feeling better - Start Prednisone taper 7 days - reviewed risks and potential side effects proceed with caution HOLD OFF on Ibuprofen Advil Aleve while on Prednisone  - Start nasal steroid Flonase 2 sprays in each nostril daily for 4-6 weeks, may repeat course  seasonally or as needed  Improve hydration by drinking plenty of clear fluids (water, gatorade) to reduce secretions and thin congestion - Congestion draining down throat can cause irritation. May try warm herbal tea with honey, cough drops  Recommend to start taking Tylenol Extra Strength 500mg  tabs - take 1 to 2 tabs per dose (max 1000mg ) every 6-8 hours for pain (take regularly, don't skip a dose for next 7 days), max 24 hour daily dose is 6 tablets or 3000mg . In the future you can repeat the same everyday Tylenol course for 1-2 weeks at a time.   - May continue over the counter cold medicine as you are, I would not use any decongestant or mucinex longer than 7 days.  Meds ordered this encounter  Medications   predniSONE (DELTASONE) 20 MG tablet    Sig: Take daily with food. Start with 60mg  (3 pills) x 2 days, then reduce to 40mg  (2 pills) x 2 days, then 20mg  (1 pill) x 3 days    Dispense:  13 tablet  Refill:  0   fluticasone (FLONASE) 50 MCG/ACT nasal spray    Sig: Place 2 sprays into both nostrils daily. Use for 4-6 weeks then stop and use seasonally or as needed.    Dispense:  16 g    Refill:  3   amoxicillin-clavulanate (AUGMENTIN) 875-125 MG tablet    Sig: Take 1 tablet by mouth 2 (two) times daily.    Dispense:  20 tablet    Refill:  0    Follow-up: - Return in 1 week PRN if not improved  Patient verbalizes understanding with the above medical recommendations including the limitation of remote medical advice.  Specific follow-up and call-back criteria were given for patient to follow-up or seek medical care more urgently if needed.   - Time spent in direct consultation with patient on phone: 9 minutes   Saralyn Pilar, DO [facility] 12/09/2021, 9:38 AM

## 2021-12-15 DIAGNOSIS — Z419 Encounter for procedure for purposes other than remedying health state, unspecified: Secondary | ICD-10-CM | POA: Diagnosis not present

## 2022-01-12 DIAGNOSIS — Z419 Encounter for procedure for purposes other than remedying health state, unspecified: Secondary | ICD-10-CM | POA: Diagnosis not present

## 2022-02-12 DIAGNOSIS — Z419 Encounter for procedure for purposes other than remedying health state, unspecified: Secondary | ICD-10-CM | POA: Diagnosis not present

## 2022-03-14 DIAGNOSIS — Z419 Encounter for procedure for purposes other than remedying health state, unspecified: Secondary | ICD-10-CM | POA: Diagnosis not present

## 2022-04-05 ENCOUNTER — Other Ambulatory Visit: Payer: Self-pay | Admitting: Family Medicine

## 2022-04-05 DIAGNOSIS — F3175 Bipolar disorder, in partial remission, most recent episode depressed: Secondary | ICD-10-CM

## 2022-04-05 NOTE — Telephone Encounter (Signed)
Medication Refill - Medication: lamoTRIgine (LAMICTAL) 100 MG tablet   Pt has reached out to behavioral health provider that last prescribed this Rx. She says neither her or the pharmacy have received a response from this provider. She wants to know if PCP will be willing to fill her Rx. Says she is completely out of her current supply.   Has the patient contacted their pharmacy? Yes.   (Agent: If no, request that the patient contact the pharmacy for the refill. If patient does not wish to contact the pharmacy document the reason why and proceed with request.) (Agent: If yes, when and what did the pharmacy advise?)  Preferred Pharmacy (with phone number or street name): [facility] [address] in [place]  Has the patient been seen for an appointment in the last year OR does the patient have an upcoming appointment? Yes.    Agent: Please be advised that RX refills may take up to 3 business days. We ask that you follow-up with your pharmacy.

## 2022-04-06 NOTE — Telephone Encounter (Signed)
Requested medication (s) are due for refill today:Amount not specified  Requested medication (s) are on the active medication list: yes    Last refill: 05/25/21 Amount not specified  Future visit scheduled no  Notes to clinic: Please review       Medication Refill - Medication: lamoTRIgine (LAMICTAL) 100 MG tablet    Pt has reached out to behavioral health provider that last prescribed this Rx. She says neither her or the pharmacy have received a response from this provider. She wants to know if PCP will be willing to fill her Rx. Says she is completely out of her current supply.   Requested Prescriptions  Pending Prescriptions Disp Refills   lamoTRIgine (LAMICTAL) 25 MG tablet      Sig: Take 1 tablet (25 mg total) by mouth 2 (two) times daily.     Neurology:  Anticonvulsants - lamotrigine Failed - 04/05/2022  1:28 PM      Failed - Cr in normal range and within 360 days    Creatinine  Date Value Ref Range Status  11/26/2014 0.70 0.60 - 1.30 mg/dL Final   Creatinine, Ser  Date Value Ref Range Status  03/13/2021 0.68 0.44 - 1.00 mg/dL Final         Failed - ALT in normal range and within 360 days    ALT  Date Value Ref Range Status  03/13/2021 18 0 - 44 U/L Final   SGPT (ALT)  Date Value Ref Range Status  11/26/2014 53 U/L Final    Comment:    14-63 NOTE: New Reference Range 06/03/14          Failed - AST in normal range and within 360 days    AST  Date Value Ref Range Status  03/13/2021 17 15 - 41 U/L Final   SGOT(AST)  Date Value Ref Range Status  11/26/2014 33 15 - 37 Unit/L Final         Passed - Completed PHQ-2 or PHQ-9 in the last 360 days      Passed - Valid encounter within last 12 months    Recent Outpatient Visits           3 months ago Acute non-recurrent maxillary sinusitis   [facility] Smitty Cords, DO   10 months ago Psoriasis of scalp   [facility] [place], Netta Neat, DO

## 2022-04-06 NOTE — Telephone Encounter (Signed)
I called the patient. She says pharmacy filled it for her already from other Dr. I told her I usually don't order this one but was willing to help. She does not need new rx. The dosage requested was incorrect as well, we had Lamictal 25mg  twice a day, she takes 100mg  once daily.  Nobie Putnam, [facility] 04/06/2022, 12:36 PM

## 2022-04-14 DIAGNOSIS — Z419 Encounter for procedure for purposes other than remedying health state, unspecified: Secondary | ICD-10-CM | POA: Diagnosis not present

## 2022-04-29 ENCOUNTER — Emergency Department
Admission: EM | Admit: 2022-04-29 | Discharge: 2022-04-29 | Disposition: A | Discharge status: Home / Self Care | Payer: Medicaid Other | Attending: Emergency Medicine | Admitting: Emergency Medicine

## 2022-04-29 ENCOUNTER — Encounter: Payer: Self-pay | Admitting: Emergency Medicine

## 2022-04-29 DIAGNOSIS — R000 Tachycardia, unspecified: Secondary | ICD-10-CM | POA: Diagnosis not present

## 2022-04-29 DIAGNOSIS — K029 Dental caries, unspecified: Secondary | ICD-10-CM | POA: Diagnosis not present

## 2022-04-29 DIAGNOSIS — K0889 Other specified disorders of teeth and supporting structures: Secondary | ICD-10-CM | POA: Diagnosis not present

## 2022-04-29 MED ORDER — IBUPROFEN 800 MG PO TABS
800.0000 mg | ORAL_TABLET | Freq: Once | ORAL | Status: AC
  Administered 2022-04-29: 800 mg via ORAL
  Filled 2022-04-29: qty 1

## 2022-04-29 MED ORDER — AMOXICILLIN 500 MG PO CAPS
1000.0000 mg | ORAL_CAPSULE | Freq: Once | ORAL | Status: AC
  Administered 2022-04-29: 1000 mg via ORAL
  Filled 2022-04-29: qty 2

## 2022-04-29 MED ORDER — AMOXICILLIN 500 MG PO TABS: 1000.0000 mg | ORAL_TABLET | Freq: Two times a day (BID) | ORAL | 0 refills | Status: AC

## 2022-04-29 MED ORDER — OXYCODONE-ACETAMINOPHEN 5-325 MG PO TABS
1.0000 | ORAL_TABLET | Freq: Once | ORAL | Status: AC
  Administered 2022-04-29: 1 via ORAL
  Filled 2022-04-29: qty 1

## 2022-04-29 MED ORDER — IBUPROFEN 800 MG PO TABS: 800.0000 mg | ORAL_TABLET | Freq: Three times a day (TID) | ORAL | 0 refills | Status: DC | PRN

## 2022-04-29 NOTE — Discharge Instructions (Addendum)
You may alternate Tylenol 1000 mg every 6 hours as needed for pain, fever and Ibuprofen 800 mg every 6-8 hours as needed for pain, fever.  Please take Ibuprofen with food.  Do not take more than 4000 mg of Tylenol (acetaminophen) in a 24 hour period. ° °

## 2022-04-29 NOTE — ED Notes (Signed)
Pt discharge information reviewed. Pt understands need for follow up care and when to return if symptoms worsen. All questions answered. Pt is alert and oriented with even and regular respirations. Pt is seen ambulating out of department with string steady gait with family.  °

## 2022-04-29 NOTE — ED Provider Notes (Signed)
[facility] Provider Note    Event Date/Time   First MD Initiated Contact with Patient 04/29/22 0340     (approximate)   History   Dental Pain   HPI  Bethany Ramsey is a 31 y.o. female with history of previous narcotic abuse and dependence currently on Suboxone who presents to the emergency department with right lower dental pain that woke her from sleep.  She has a known cavity and is supposed to have this tooth pulled.  She denies any fevers, facial swelling.  No difficulty swallowing, speaking or breathing.   History provided by patient and mother.    Past Medical History:  Diagnosis Date   Drug abuse and dependence (HCC)    Miscarriage    Obesity (BMI 30-39.9)    Preeclampsia    when pregnant with son 2013    Past Surgical History:  Procedure Laterality Date   TUBAL LIGATION Bilateral 08/21/2019   Procedure: POST PARTUM TUBAL LIGATION;  Surgeon: Hildred Laser, MD;  Location: [facility] ORS;  Service: Gynecology;  Laterality: Bilateral;    MEDICATIONS:  Prior to Admission medications   Medication Sig Start Date End Date Taking? Authorizing Provider  amoxicillin (AMOXIL) 500 MG tablet Take 2 tablets (1,000 mg total) by mouth 2 (two) times daily for 10 days. 04/29/22 05/09/22 Yes Lilyona Richner, Layla Maw, DO  Cholecalciferol (VITAMIN D3) 125 MCG (5000 UT) CAPS Take 1 capsule (5,000 Units total) by mouth daily. Patient not taking: Reported on 11/09/2020 08/22/19   Shambley, Melody N, CNM  clobetasol (TEMOVATE) 0.05 % external solution Apply 1 application topically 2 (two) times daily as needed (scalp itching psoriasis flare). 05/25/21   Karamalegos, Netta Neat, DO  fluticasone (FLONASE) 50 MCG/ACT nasal spray Place 2 sprays into both nostrils daily. Use for 4-6 weeks then stop and use seasonally or as needed. 12/09/21   Karamalegos, Netta Neat, DO  ibuprofen (ADVIL) 800 MG tablet Take 1 tablet (800 mg total) by mouth every 8 (eight) hours as needed for mild pain.  04/29/22   Theo Krumholz, Layla Maw, DO  lamoTRIgine (LAMICTAL) 100 MG tablet Take 1 tablet (100 mg total) by mouth daily. 04/06/22   Karamalegos, Alexander J, DO  SUBOXONE 8-2 MG FILM Place under the tongue daily. 03/11/21   [provider]    Physical Exam   Triage Vital Signs: ED Triage Vitals  Enc Vitals Group     BP 04/29/22 0259 114/90     Pulse Rate 04/29/22 0259 (!) 104     Resp 04/29/22 0259 20     Temp 04/29/22 0259 98.6 F (37 C)     Temp Source 04/29/22 0259 Oral     SpO2 04/29/22 0259 98 %     Weight 04/29/22 0258 245 lb (111.1 kg)     Height 04/29/22 0258 5\' 8"  (1.727 m)     Head Circumference --      Peak Flow --      Pain Score 04/29/22 0258 10     Pain Loc --      Pain Edu? --      Excl. in GC? --     Most recent vital signs: Vitals:   04/29/22 0259  BP: 114/90  Pulse: (!) 104  Resp: 20  Temp: 98.6 F (37 C)  SpO2: 98%    CONSTITUTIONAL: Alert and oriented and responds appropriately to questions. Well-appearing; well-nourished HEAD: Normocephalic, atraumatic EYES: Conjunctivae clear, pupils appear equal, sclera nonicteric ENT: normal nose; moist mucous membranes; Dental  caries present with pain over the posterior right lower molar, no drainable dental abscess noted, gums appear normal without bleeding, no Ludwig's angina, tongue sits flat in the bottom of the mouth, no angioedema, no facial erythema or warmth, no facial swelling, moves neck without difficulty, normal phonation without stridor, trismus or drooling. NECK: Supple, normal ROM CARD: RRR; S1 and S2 appreciated; no murmurs, no clicks, no rubs, no gallops RESP: Normal chest excursion without splinting or tachypnea; breath sounds clear and equal bilaterally; no wheezes, no rhonchi, no rales, no hypoxia or respiratory distress, speaking full sentences ABD/GI: Normal bowel sounds; non-distended; soft, non-tender, no rebound, no guarding, no peritoneal signs BACK: The back appears normal EXT: Normal  ROM in all joints; no deformity noted, no edema; no cyanosis SKIN: Normal color for age and race; warm; no rash on exposed skin NEURO: Moves all extremities equally, normal speech PSYCH: The patient's mood and manner are appropriate.   ED Results / Procedures / Treatments   LABS: (all labs ordered are listed, but only abnormal results are displayed) Labs Reviewed - No data to display   EKG:  RADIOLOGY: My personal review and interpretation of imaging:    I have personally reviewed all radiology reports.   No results found.   PROCEDURES:  Critical Care performed: No     Procedures    IMPRESSION / MDM / ASSESSMENT AND PLAN / ED COURSE  I reviewed the triage vital signs and the nursing notes.       DIFFERENTIAL DIAGNOSIS (includes but not limited to):   Dental caries causing dental pain, dental abscess, no sign of Ludwigs, parotitis, facial cellulitis   Patient's presentation is most consistent with acute presentation with potential threat to life or bodily function.   PLAN: Patient here with right lower dental pain.  She appears very uncomfortable and is tearful, tachycardic here.  She received 1 Percocet in triage and states pain is improved.  Will give ibuprofen and a second Percocet.  Given she is on Suboxone she states she will not be able to be given a prescription for narcotics.  Discussed with her that she may have an underlying infection and I suspect symptoms will improve with amoxicillin but have recommended close follow-up with dentistry.  We will give outpatient follow-up information.  She does have a dentist and was told that this tooth would need to be removed.  No sign of any drainable abscess or facial cellulitis here.  No sign of parotitis.  Airway is patent and she is tolerating her secretions.  No Ludwigs.     MEDICATIONS GIVEN IN ED: Medications  oxyCODONE-acetaminophen (PERCOCET/ROXICET) 5-325 MG per tablet 1 tablet (1 tablet Oral Given 04/29/22  0302)  amoxicillin (AMOXIL) capsule 1,000 mg (1,000 mg Oral Given 04/29/22 0409)  ibuprofen (ADVIL) tablet 800 mg (800 mg Oral Given 04/29/22 0408)  oxyCODONE-acetaminophen (PERCOCET/ROXICET) 5-325 MG per tablet 1 tablet (1 tablet Oral Given 04/29/22 0408)     ED COURSE:  At this time, I do not feel there is any life-threatening condition present. I reviewed all nursing notes, vitals, pertinent previous records.  All lab and urine results, EKGs, imaging ordered have been independently reviewed and interpreted by myself.  I reviewed all available radiology reports from any imaging ordered this visit.  Based on my assessment, I feel the patient is safe to be discharged home without further emergent workup and can continue workup as an outpatient as needed. Discussed all findings, treatment plan as well as usual and  customary return precautions with patient and mother.  They verbalize understanding and are comfortable with this plan.  Outpatient follow-up has been provided as needed.  All questions have been answered.    CONSULTS: No emergent dental consult needed at this time for uncomplicated dental caries causing dental pain.  Started patient on prophylactic antibiotics for possible developing dental periapical abscess.   OUTSIDE RECORDS REVIEWED: Reviewed patient's previous pulmonology note with Ned Clines on 03/24/2021.       FINAL CLINICAL IMPRESSION(S) / ED DIAGNOSES   Final diagnoses:  Pain due to dental caries     Rx / DC Orders   ED Discharge Orders          Ordered    ibuprofen (ADVIL) 800 MG tablet  Every 8 hours PRN,   Status:  Discontinued        04/29/22 0347    amoxicillin (AMOXIL) 500 MG tablet  2 times daily        04/29/22 0347    ibuprofen (ADVIL) 800 MG tablet  Every 8 hours PRN        04/29/22 0347             Note:  This document was prepared using Dragon voice recognition software and may include unintentional dictation errors.   Silva Aamodt, Layla Maw,  DO 04/29/22 [phone]

## 2022-04-29 NOTE — ED Triage Notes (Signed)
Pt presents via POV with complaints of right sided lower tooth pain that became severe tonight. Pt states the pain has been intermittent for the last few weeks. No meds taken PTA - Pt tearful in triage due to the pain. Denies fevers.

## 2022-05-14 DIAGNOSIS — Z419 Encounter for procedure for purposes other than remedying health state, unspecified: Secondary | ICD-10-CM | POA: Diagnosis not present

## 2022-06-14 DIAGNOSIS — Z419 Encounter for procedure for purposes other than remedying health state, unspecified: Secondary | ICD-10-CM | POA: Diagnosis not present

## 2022-07-15 DIAGNOSIS — Z419 Encounter for procedure for purposes other than remedying health state, unspecified: Secondary | ICD-10-CM | POA: Diagnosis not present

## 2022-07-29 ENCOUNTER — Ambulatory Visit: Payer: Medicaid Other | Admitting: Family Medicine

## 2022-08-05 DIAGNOSIS — Z79899 Other long term (current) drug therapy: Secondary | ICD-10-CM | POA: Diagnosis not present

## 2022-08-11 ENCOUNTER — Encounter: Payer: Self-pay | Admitting: Family Medicine

## 2022-08-11 ENCOUNTER — Ambulatory Visit: Payer: Medicaid Other | Admitting: Family Medicine

## 2022-08-11 VITALS — BP 130/84 | HR 73 | Ht 68.0 in | Wt 186.4 lb

## 2022-08-11 DIAGNOSIS — F3175 Bipolar disorder, in partial remission, most recent episode depressed: Secondary | ICD-10-CM | POA: Diagnosis not present

## 2022-08-11 DIAGNOSIS — F902 Attention-deficit hyperactivity disorder, combined type: Secondary | ICD-10-CM | POA: Diagnosis not present

## 2022-08-11 MED ORDER — BUPROPION HCL ER (SR) 100 MG PO TB12: 100.0000 mg | ORAL_TABLET | Freq: Two times a day (BID) | ORAL | 2 refills | Status: DC

## 2022-08-11 MED ORDER — ATOMOXETINE HCL 40 MG PO CAPS: 40.0000 mg | ORAL_CAPSULE | Freq: Every day | ORAL | 0 refills | Status: DC

## 2022-08-11 NOTE — Patient Instructions (Addendum)
Thank you for coming to the office today.  Start the  generic Strattera 40mg  daily for ADHD symptoms  Start the Wellbutrin lower dose 100mg  twice a day, 8-10 hours apart, (avoid taking after 8pm)  I will coordinate with Darleene Cleaver at [facility] to help make arrangements for future Bipolar eval and ADHD treatment plans.  Please schedule a Follow-up Appointment to: Return if symptoms worsen or fail to improve.  If you have any other questions or concerns, please feel free to call the office or send a message through [place]. You may also schedule an earlier appointment if necessary.  Additionally, you may be receiving a survey about your experience at our office within a few days to 1 week by e-mail or mail. We value your feedback.  Nobie Putnam, DO [place]

## 2022-08-11 NOTE — Progress Notes (Signed)
0  Subjective:    Patient ID: Bethany Ramsey, female    DOB: June 07, 1991, 31 y.o.   MRN: [id number]  Bethany Ramsey is a 31 y.o. female presenting on 08/11/2022 for ADHD and Anxiety   HPI  Bipolar, unspecified Opiate Dependence, on agonist therapy Previously Followed by [facility] previously on Abilify then switched to Lamotrigine Also previously on Wellbutrin felt dizzy   She reports background history of recreational use of opiates years prior, not prescribed for any medical condition or pain. then due to pregnancy, switched to Subutex during the pregnancy, then to Suboxone. She is working on tapering down - Primarily mom and sister support system  Diagnosed ADHD age 89, she was not placed on medication at that time. She had pursued therapist - She describes fidgety and irritable more easily, and restless at times. - She admits issue while talking she will cut them off, she has difficulty focusing on one conversation - Admits racing thoughts - Admits forgetfulness - Admits impulsive behavior and she would act on this. - Issue with mood swings at time. Overall her daily function is impacted by this.  She had been going to [facility], and now they have closed down recently. - She is going to [facility] for their suboxone,  - She is no longer on Lamotrigine, off for 6 months  Followed by Darleene Cleaver NP       05/25/2021    3:10 PM  Depression screen PHQ 2/9  Decreased Interest 0  Down, Depressed, Hopeless 0  PHQ - 2 Score 0  Altered sleeping 0  Tired, decreased energy 0  Change in appetite 0  Feeling bad or failure about yourself  0  Trouble concentrating 0  Moving slowly or fidgety/restless 0  Suicidal thoughts 0  PHQ-9 Score 0  Difficult doing work/chores Not difficult at all      05/25/2021    3:10 PM  GAD 7 : Generalized Anxiety Score  Nervous, Anxious, on Edge 0  Control/stop worrying 0   Worry too much - different things 0  Trouble relaxing 0  Restless 0  Easily annoyed or irritable 0  Afraid - awful might happen 0  Total GAD 7 Score 0  Anxiety Difficulty Not difficult at all      Social History   Tobacco Use   Smoking status: Former    Packs/day: 0.50    Years: 8.00    Total pack years: 4.00    Types: Cigarettes    Quit date: 08/06/2019    Years since quitting: 3.0   Smokeless tobacco: Never  Vaping Use   Vaping Use: Former   Start date: 08/06/2019   Quit date: 03/13/2021  Substance Use Topics   Alcohol use: No   Drug use: Not Currently    Types: Marijuana    Review of Systems Per HPI unless specifically indicated above     Objective:    BP 130/84 (BP Location: Left Arm, Cuff Size: Normal)   Pulse 73   Ht 5\' 8"  (1.727 m)   Wt 186 lb 6.4 oz (84.6 kg)   SpO2 100%   BMI 28.34 kg/m   Wt Readings from Last 3 Encounters:  08/11/22 186 lb 6.4 oz (84.6 kg)  04/29/22 245 lb (111.1 kg)  12/09/21 237 lb (107.5 kg)    Physical Exam Vitals and nursing note reviewed.  Constitutional:      General: She is not in acute distress.  Appearance: She is well-developed. She is not diaphoretic.     Comments: Well-appearing, comfortable, cooperative  HENT:     Head: Normocephalic and atraumatic.  Eyes:     General:        Right eye: No discharge.        Left eye: No discharge.     Conjunctiva/sclera: Conjunctivae normal.  Neck:     Thyroid: No thyromegaly.  Cardiovascular:     Rate and Rhythm: Normal rate and regular rhythm.     Heart sounds: Normal heart sounds. No murmur heard. Pulmonary:     Effort: Pulmonary effort is normal. No respiratory distress.     Breath sounds: Normal breath sounds. No wheezing or rales.  Musculoskeletal:        General: Normal range of motion.     Cervical back: Normal range of motion and neck supple.  Lymphadenopathy:     Cervical: No cervical adenopathy.  Skin:    General: Skin is warm and dry.     Findings: No  erythema or rash.  Neurological:     Mental Status: She is alert and oriented to person, place, and time.  Psychiatric:        Behavior: Behavior normal.     Comments: Well groomed, good eye contact, appears somewhat anxious    Results for orders placed or performed during the hospital encounter of 03/13/21  CBC with Differential  Result Value Ref Range   WBC 4.5 4.0 - 10.5 K/uL   RBC 4.38 3.87 - 5.11 MIL/uL   Hemoglobin 13.2 12.0 - 15.0 g/dL   HCT 75.1 02.5 - 85.2 %   MCV 91.1 80.0 - 100.0 fL   MCH 30.1 26.0 - 34.0 pg   MCHC 33.1 30.0 - 36.0 g/dL   RDW 77.8 24.2 - 35.3 %   Platelets 183 150 - 400 K/uL   nRBC 0.0 0.0 - 0.2 %   Neutrophils Relative % 59 %   Neutro Abs 2.7 1.7 - 7.7 K/uL   Lymphocytes Relative 32 %   Lymphs Abs 1.4 0.7 - 4.0 K/uL   Monocytes Relative 6 %   Monocytes Absolute 0.3 0.1 - 1.0 K/uL   Eosinophils Relative 3 %   Eosinophils Absolute 0.1 0.0 - 0.5 K/uL   Basophils Relative 0 %   Basophils Absolute 0.0 0.0 - 0.1 K/uL   Immature Granulocytes 0 %   Abs Immature Granulocytes 0.01 0.00 - 0.07 K/uL  Comprehensive metabolic panel  Result Value Ref Range   Sodium 138 135 - 145 mmol/L   Potassium 4.4 3.5 - 5.1 mmol/L   Chloride 105 98 - 111 mmol/L   CO2 26 22 - 32 mmol/L   Glucose, Bld 107 (H) 70 - 99 mg/dL   BUN 12 6 - 20 mg/dL   Creatinine, Ser 6.14 0.44 - 1.00 mg/dL   Calcium 9.4 8.9 - 43.1 mg/dL   Total Protein 7.1 6.5 - 8.1 g/dL   Albumin 4.3 3.5 - 5.0 g/dL   AST 17 15 - 41 U/L   ALT 18 0 - 44 U/L   Alkaline Phosphatase 59 38 - 126 U/L   Total Bilirubin 1.5 (H) 0.3 - 1.2 mg/dL   GFR, Estimated >54 >00 mL/min   Anion gap 7 5 - 15  D-dimer, quantitative  Result Value Ref Range   D-Dimer, Quant <0.27 0.00 - 0.50 ug/mL-FEU      Assessment & Plan:   Problem List Items Addressed This Visit  Bipolar disorder, unspecified (HCC) - Primary   Relevant Medications   buPROPion ER (WELLBUTRIN SR) 100 MG 12 hr tablet   Other Visit Diagnoses      Attention deficit hyperactivity disorder (ADHD), combined type       Relevant Medications   atomoxetine (STRATTERA) 40 MG capsule   buPROPion ER (WELLBUTRIN SR) 100 MG 12 hr tablet       Concern with presumed underlying mood disorder - suspected bipolar type with underlying anxiety and ADHD component contributing as comorbid factors Previously managed by mental health Additional factor with opiate dependency, now on suboxone with goals to wean and taper off  No longer w/ [facility] since they have closed Followed by [facility] now, managing on Suboxone Not on psych medication management however at this time.  Discussion that my goal is to help manage these conditions, however it is complex given multiple factors, and I am not comfortable solely managing her ADHD if there is possibly underlying bipolar disorder.  I would avoid SSRI SNRI today. I am hesitant about immediately starting stimulant ADHD therapy as well without further diagnosis and recommendation from psychiatry  We agreed on Wellbutrin low dose SR BID dosing 100mg  twice a day, 8-10 hours apart, (avoid taking after 8pm) for both mood and ADHD component  Also start Strattera generic 40mg  daily for ADHD non stimulant  I will coordinate with at [facility] to help make arrangements for future Bipolar eval and ADHD treatment plans.  Meds ordered this encounter  Medications   atomoxetine (STRATTERA) 40 MG capsule    Sig: Take 1 capsule (40 mg total) by mouth daily.    Dispense:  30 capsule    Refill:  0   buPROPion ER (WELLBUTRIN SR) 100 MG 12 hr tablet    Sig: Take 1 tablet (100 mg total) by mouth 2 (two) times daily.    Dispense:  60 tablet    Refill:  2      Follow up plan: Return if symptoms worsen or fail to improve.   Toya Smothers, DO [facility] 08/11/2022, 10:39 AM

## 2022-08-14 DIAGNOSIS — Z419 Encounter for procedure for purposes other than remedying health state, unspecified: Secondary | ICD-10-CM | POA: Diagnosis not present

## 2022-08-25 DIAGNOSIS — Z79899 Other long term (current) drug therapy: Secondary | ICD-10-CM | POA: Diagnosis not present

## 2022-08-31 ENCOUNTER — Encounter: Payer: Self-pay | Admitting: Certified Nurse Midwife

## 2022-09-08 ENCOUNTER — Other Ambulatory Visit: Payer: Self-pay | Admitting: Family Medicine

## 2022-09-08 DIAGNOSIS — F902 Attention-deficit hyperactivity disorder, combined type: Secondary | ICD-10-CM

## 2022-09-08 NOTE — Telephone Encounter (Signed)
Requested medications are due for refill today.  yes  Requested medications are on the active medications list.  yes  Last refill. 08/11/2022  Future visit scheduled.   no  Notes to clinic.  Medication refill is not delegated.    Requested Prescriptions  Pending Prescriptions Disp Refills   atomoxetine (STRATTERA) 40 MG capsule [Pharmacy Med Name: ATOMOXETINE 40MG  CAPSULES] 30 capsule 0    Sig: TAKE 1 CAPSULE(40 MG) BY MOUTH DAILY     Not Delegated - Psychiatry: Norepinephrine Reuptake Inhibitor Failed - 09/08/2022  3:37 AM      Failed - This refill cannot be delegated      Passed - Completed PHQ-2 or PHQ-9 in the last 360 days      Passed - Last BP in normal range    BP Readings from Last 1 Encounters:  08/11/22 130/84         Passed - Last Heart Rate in normal range    Pulse Readings from Last 1 Encounters:  08/11/22 73         Passed - Valid encounter within last 6 months    Recent Outpatient Visits           4 weeks ago Bipolar disorder, in partial remission, most recent episode depressed [facility])   [place], DO   9 months ago Acute non-recurrent maxillary sinusitis   [place], DO   1 year ago Psoriasis of scalp   [place], Devonne Doughty, [place]

## 2022-09-14 DIAGNOSIS — Z419 Encounter for procedure for purposes other than remedying health state, unspecified: Secondary | ICD-10-CM | POA: Diagnosis not present

## 2022-09-29 DIAGNOSIS — Z79899 Other long term (current) drug therapy: Secondary | ICD-10-CM | POA: Diagnosis not present

## 2022-10-14 DIAGNOSIS — Z419 Encounter for procedure for purposes other than remedying health state, unspecified: Secondary | ICD-10-CM | POA: Diagnosis not present

## 2022-10-27 DIAGNOSIS — Z79899 Other long term (current) drug therapy: Secondary | ICD-10-CM | POA: Diagnosis not present

## 2022-11-14 DIAGNOSIS — Z419 Encounter for procedure for purposes other than remedying health state, unspecified: Secondary | ICD-10-CM | POA: Diagnosis not present

## 2022-11-18 ENCOUNTER — Emergency Department
Admission: EM | Admit: 2022-11-18 | Discharge: 2022-11-18 | Disposition: A | Discharge status: Home / Self Care | Payer: Medicaid Other | Attending: Emergency Medicine | Admitting: Emergency Medicine

## 2022-11-18 ENCOUNTER — Encounter: Payer: Self-pay | Admitting: Emergency Medicine

## 2022-11-18 ENCOUNTER — Emergency Department: Payer: Medicaid Other

## 2022-11-18 ENCOUNTER — Other Ambulatory Visit: Payer: Self-pay

## 2022-11-18 DIAGNOSIS — S161XXA Strain of muscle, fascia and tendon at neck level, initial encounter: Secondary | ICD-10-CM | POA: Diagnosis not present

## 2022-11-18 DIAGNOSIS — S60222A Contusion of left hand, initial encounter: Secondary | ICD-10-CM | POA: Insufficient documentation

## 2022-11-18 DIAGNOSIS — M542 Cervicalgia: Secondary | ICD-10-CM | POA: Diagnosis not present

## 2022-11-18 DIAGNOSIS — M79642 Pain in left hand: Secondary | ICD-10-CM | POA: Diagnosis not present

## 2022-11-18 MED ORDER — IBUPROFEN 600 MG PO TABS: 600.0000 mg | ORAL_TABLET | Freq: Four times a day (QID) | ORAL | 0 refills | Status: DC | PRN
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
## 2022-11-24 ENCOUNTER — Encounter: Payer: Self-pay | Admitting: Family Medicine

## 2022-11-24 ENCOUNTER — Ambulatory Visit (INDEPENDENT_AMBULATORY_CARE_PROVIDER_SITE_OTHER): Payer: Medicaid Other | Admitting: Family Medicine

## 2022-11-24 VITALS — BP 138/85 | HR 67 | Ht 68.0 in | Wt 174.6 lb

## 2022-11-24 DIAGNOSIS — M545 Low back pain, unspecified: Secondary | ICD-10-CM

## 2022-11-24 DIAGNOSIS — M79642 Pain in left hand: Secondary | ICD-10-CM | POA: Diagnosis not present

## 2022-11-24 DIAGNOSIS — M62838 Other muscle spasm: Secondary | ICD-10-CM

## 2022-11-24 DIAGNOSIS — M542 Cervicalgia: Secondary | ICD-10-CM | POA: Diagnosis not present

## 2022-11-24 MED ORDER — BACLOFEN 10 MG PO TABS: 5.0000 mg | ORAL_TABLET | Freq: Three times a day (TID) | ORAL | 1 refills | Status: DC | PRN
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
## 2022-12-15 DIAGNOSIS — Z79899 Other long term (current) drug therapy: Secondary | ICD-10-CM | POA: Diagnosis not present

## 2022-12-15 DIAGNOSIS — Z419 Encounter for procedure for purposes other than remedying health state, unspecified: Secondary | ICD-10-CM | POA: Diagnosis not present

## 2022-12-26 ENCOUNTER — Ambulatory Visit (INDEPENDENT_AMBULATORY_CARE_PROVIDER_SITE_OTHER): Payer: Medicaid Other | Admitting: Family Medicine

## 2022-12-26 ENCOUNTER — Other Ambulatory Visit (HOSPITAL_COMMUNITY)
Admission: RE | Admit: 2022-12-26 | Discharge: 2022-12-26 | Disposition: A | Discharge status: Home / Self Care | Payer: Medicaid Other | Source: Ambulatory Visit | Attending: Family Medicine | Admitting: Family Medicine

## 2022-12-26 ENCOUNTER — Encounter: Payer: Self-pay | Admitting: Family Medicine

## 2022-12-26 VITALS — BP 112/66 | HR 74 | Ht 68.0 in | Wt 174.2 lb

## 2022-12-26 DIAGNOSIS — Z113 Encounter for screening for infections with a predominantly sexual mode of transmission: Secondary | ICD-10-CM | POA: Diagnosis not present

## 2022-12-26 DIAGNOSIS — N760 Acute vaginitis: Secondary | ICD-10-CM | POA: Insufficient documentation

## 2022-12-26 DIAGNOSIS — Z114 Encounter for screening for human immunodeficiency virus [HIV]: Secondary | ICD-10-CM | POA: Diagnosis not present

## 2022-12-26 DIAGNOSIS — N898 Other specified noninflammatory disorders of vagina: Secondary | ICD-10-CM

## 2022-12-26 MED ORDER — AZITHROMYCIN 500 MG PO TABS: 1000.0000 mg | ORAL_TABLET | Freq: Once | ORAL | 0 refills | Status: AC

## 2022-12-26 MED ORDER — METRONIDAZOLE 500 MG PO TABS: 500.0000 mg | ORAL_TABLET | Freq: Two times a day (BID) | ORAL | 0 refills | Status: DC

## 2022-12-26 NOTE — Progress Notes (Signed)
Subjective:    Patient ID: Bethany Ramsey, female    DOB: 01-08-1991, 32 y.o.   MRN: [id number]  Bethany Ramsey is a 32 y.o. female presenting on 12/26/2022 for Vaginal Discharge and Abdominal Pain  Patient presents for a same day appointment.  HPI  Vaginal Discharge Reports new concern recently with vaginal discharge. She has been monogamous with her partner after has had child. She is concerned for potential STD, and has symptoms now that are similar to prior history of chlamydia, had previously 03/13/19. She has vaginal discharge and discomfort Denies fever chills nausea vomiting body aches genital ulcer rash dysuria frequency hematuria       11/24/2022   10:16 AM 08/11/2022    5:52 PM 05/25/2021    3:10 PM  Depression screen PHQ 2/9  Decreased Interest 0 1 0  Down, Depressed, Hopeless 0 1 0  PHQ - 2 Score 0 2 0  Altered sleeping 0 3 0  Tired, decreased energy 0 1 0  Change in appetite 0 1 0  Feeling bad or failure about yourself  0 0 0  Trouble concentrating 0 3 0  Moving slowly or fidgety/restless 0 0 0  Suicidal thoughts 0 0 0  PHQ-9 Score 0 10 0  Difficult doing work/chores Not difficult at all Not difficult at all Not difficult at all    Social History   Tobacco Use   Smoking status: Former    Packs/day: 0.50    Years: 8.00    Total pack years: 4.00    Types: Cigarettes    Quit date: 08/06/2019    Years since quitting: 3.3   Smokeless tobacco: Never  Vaping Use   Vaping Use: Former   Start date: 08/06/2019   Quit date: 03/13/2021  Substance Use Topics   Alcohol use: No   Drug use: Not Currently    Types: Marijuana    Review of Systems Per HPI unless specifically indicated above     Objective:    BP 112/66   Pulse 74   Ht 5' 8"$  (1.727 m)   Wt 174 lb 3.2 oz (79 kg)   SpO2 99%   BMI 26.49 kg/m   Wt Readings from Last 3 Encounters:  12/26/22 174 lb 3.2 oz (79 kg)  11/24/22 174 lb 9.6 oz (79.2 kg)  11/18/22 180 lb (81.6 kg)    Physical Exam Vitals  and nursing note reviewed.  Constitutional:      General: She is not in acute distress.    Appearance: Normal appearance. She is well-developed. She is not diaphoretic.     Comments: Well-appearing, comfortable, cooperative  HENT:     Head: Normocephalic and atraumatic.  Eyes:     General:        Right eye: No discharge.        Left eye: No discharge.     Conjunctiva/sclera: Conjunctivae normal.  Cardiovascular:     Rate and Rhythm: Normal rate.  Pulmonary:     Effort: Pulmonary effort is normal.  Skin:    General: Skin is warm and dry.     Findings: No erythema or rash.  Neurological:     Mental Status: She is alert and oriented to person, place, and time.  Psychiatric:        Mood and Affect: Mood normal.        Behavior: Behavior normal.        Thought Content: Thought content normal.     Comments: Well groomed, good  eye contact, normal speech and thoughts    Results for orders placed or performed during the hospital encounter of 03/13/21  CBC with Differential  Result Value Ref Range   WBC 4.5 4.0 - 10.5 K/uL   RBC 4.38 3.87 - 5.11 MIL/uL   Hemoglobin 13.2 12.0 - 15.0 g/dL   HCT 39.9 36.0 - 46.0 %   MCV 91.1 80.0 - 100.0 fL   MCH 30.1 26.0 - 34.0 pg   MCHC 33.1 30.0 - 36.0 g/dL   RDW 12.2 11.5 - 15.5 %   Platelets 183 150 - 400 K/uL   nRBC 0.0 0.0 - 0.2 %   Neutrophils Relative % 59 %   Neutro Abs 2.7 1.7 - 7.7 K/uL   Lymphocytes Relative 32 %   Lymphs Abs 1.4 0.7 - 4.0 K/uL   Monocytes Relative 6 %   Monocytes Absolute 0.3 0.1 - 1.0 K/uL   Eosinophils Relative 3 %   Eosinophils Absolute 0.1 0.0 - 0.5 K/uL   Basophils Relative 0 %   Basophils Absolute 0.0 0.0 - 0.1 K/uL   Immature Granulocytes 0 %   Abs Immature Granulocytes 0.01 0.00 - 0.07 K/uL  Comprehensive metabolic panel  Result Value Ref Range   Sodium 138 135 - 145 mmol/L   Potassium 4.4 3.5 - 5.1 mmol/L   Chloride 105 98 - 111 mmol/L   CO2 26 22 - 32 mmol/L   Glucose, Bld 107 (H) 70 - 99 mg/dL    BUN 12 6 - 20 mg/dL   Creatinine, Ser 0.68 0.44 - 1.00 mg/dL   Calcium 9.4 8.9 - 10.3 mg/dL   Total Protein 7.1 6.5 - 8.1 g/dL   Albumin 4.3 3.5 - 5.0 g/dL   AST 17 15 - 41 U/L   ALT 18 0 - 44 U/L   Alkaline Phosphatase 59 38 - 126 U/L   Total Bilirubin 1.5 (H) 0.3 - 1.2 mg/dL   GFR, Estimated >60 >60 mL/min   Anion gap 7 5 - 15  D-dimer, quantitative  Result Value Ref Range   D-Dimer, Quant <0.27 0.00 - 0.50 ug/mL-FEU      Assessment & Plan:   Problem List Items Addressed This Visit   None Visit Diagnoses     Acute vaginitis    -  Primary   Relevant Medications   azithromycin (ZITHROMAX) 500 MG tablet   metroNIDAZOLE (FLAGYL) 500 MG tablet   Other Relevant Orders   Cervicovaginal ancillary only   Screening examination for STD (sexually transmitted disease)       Relevant Medications   azithromycin (ZITHROMAX) 500 MG tablet   metroNIDAZOLE (FLAGYL) 500 MG tablet   Other Relevant Orders   Cervicovaginal ancillary only   HIV Antibody (routine testing w rflx)   RPR   Vaginal discharge       Relevant Orders   Cervicovaginal ancillary only   Screening for HIV (human immunodeficiency virus)       Relevant Medications   metroNIDAZOLE (FLAGYL) 500 MG tablet   Other Relevant Orders   HIV Antibody (routine testing w rflx)       Clinically with symptoms with vaginitis / itching currently Uncertain about STD Exposure   Plan: STD testing collected - GC/Chlamdyia swab, HIV and RPR - f/u results this week  Self collect vaginal swab   Printed all rx for potential treatment as indicated for either potential Chlamydia or BV   If the test is positive for Chlamydia - then please take the Azithromycin  558m x 2 = 10055m if you do get sick w this, let usKoreanow and we can switch to the Doxycycline for 1 week.   If gonorrhea is positive, then you would need an injection / shot antibiotic to treat it. Can be done at Urgent Care / ED / Health Dept, we can also arrange here, but it  should be promptly treated.   If it is Trichmonas or BV Bacterial Vaginosis - take the Metronidazole (Flagyl)    Meds ordered this encounter  Medications   azithromycin (ZITHROMAX) 500 MG tablet    Sig: Take 2 tablets (1,000 mg total) by mouth once for 1 dose. Take with food. If nausea/vomit, then notify doctor office for alternative rx.    Dispense:  2 tablet    Refill:  0   metroNIDAZOLE (FLAGYL) 500 MG tablet    Sig: Take 1 tablet (500 mg total) by mouth 2 (two) times daily. Do not drink alcohol while taking this medicine.    Dispense:  14 tablet    Refill:  0      Follow up plan: Return if symptoms worsen or fail to improve.   AlNobie PutnamDOChevakedical Group 12/26/2022, 2:19 PM

## 2022-12-26 NOTE — Patient Instructions (Addendum)
Thank you for coming to the office today.  Testing today for all STDs   Chlamydia / Gonorrhea - in the swab test.   If the test is positive for Chlamydia - then please take the Azithromycin 517m x 2 = 10032m if you do get sick w this, let usKoreanow and we can switch to the Doxycycline for 1 week.   If gonorrhea is positive, then you would need an injection / shot antibiotic to treat it. Can be done at Urgent Care / ED / Health Dept, we can also arrange here, but it should be promptly treated.   If it is Trichmonas or BV Bacterial Vaginosis - take the Metronidazole (Flagyl)   If it is yeast / canididal infection - then take the Diflucan (Fluconazole)    Please schedule a Follow-up Appointment to: Return if symptoms worsen or fail to improve.  If you have any other questions or concerns, please feel free to call the office or send a message through [facility]You may also schedule an earlier appointment if necessary.  Additionally, you may be receiving a survey about your experience at our office within a few days to 1 week by e-mail or mail. We value your feedback.  AlNobie PutnamDO [facility]

## 2022-12-27 LAB — RPR: RPR Ser Ql: NONREACTIVE

## 2022-12-27 LAB — HIV ANTIBODY (ROUTINE TESTING W REFLEX): HIV 1&2 Ab, 4th Generation: NONREACTIVE

## 2022-12-28 LAB — CERVICOVAGINAL ANCILLARY ONLY
Bacterial Vaginitis (gardnerella): NEGATIVE
Candida Glabrata: NEGATIVE
Candida Vaginitis: NEGATIVE
Chlamydia: NEGATIVE
Comment: NEGATIVE
Comment: NEGATIVE
Comment: NEGATIVE
Comment: NEGATIVE
Comment: NEGATIVE
Comment: NORMAL
Neisseria Gonorrhea: NEGATIVE
Trichomonas: POSITIVE — AB

## 2023-01-13 DIAGNOSIS — Z419 Encounter for procedure for purposes other than remedying health state, unspecified: Secondary | ICD-10-CM | POA: Diagnosis not present

## 2023-02-13 DIAGNOSIS — Z419 Encounter for procedure for purposes other than remedying health state, unspecified: Secondary | ICD-10-CM | POA: Diagnosis not present

## 2023-03-15 DIAGNOSIS — Z419 Encounter for procedure for purposes other than remedying health state, unspecified: Secondary | ICD-10-CM | POA: Diagnosis not present

## 2023-04-06 DIAGNOSIS — F900 Attention-deficit hyperactivity disorder, predominantly inattentive type: Secondary | ICD-10-CM | POA: Diagnosis not present

## 2023-04-06 DIAGNOSIS — Z79899 Other long term (current) drug therapy: Secondary | ICD-10-CM | POA: Diagnosis not present

## 2023-04-15 DIAGNOSIS — Z419 Encounter for procedure for purposes other than remedying health state, unspecified: Secondary | ICD-10-CM | POA: Diagnosis not present

## 2023-04-28 ENCOUNTER — Ambulatory Visit: Payer: Self-pay

## 2023-04-28 ENCOUNTER — Ambulatory Visit: Payer: Medicaid Other | Admitting: Family Medicine

## 2023-04-28 ENCOUNTER — Encounter: Payer: Self-pay | Admitting: Family Medicine

## 2023-04-28 VITALS — BP 136/88 | HR 65 | Temp 96.5°F | Wt 184.0 lb

## 2023-04-28 DIAGNOSIS — J0100 Acute maxillary sinusitis, unspecified: Secondary | ICD-10-CM | POA: Diagnosis not present

## 2023-04-28 MED ORDER — PREDNISONE 20 MG PO TABS: ORAL_TABLET | ORAL | 0 refills | Status: DC

## 2023-04-28 MED ORDER — AMOXICILLIN-POT CLAVULANATE 875-125 MG PO TABS: 1.0000 | ORAL_TABLET | Freq: Two times a day (BID) | ORAL | 0 refills | Status: DC

## 2023-04-28 MED ORDER — IPRATROPIUM BROMIDE 0.06 % NA SOLN: 2.0000 | Freq: Four times a day (QID) | NASAL | 0 refills | Status: DC

## 2023-04-28 MED ORDER — BENZONATATE 100 MG PO CAPS: 100.0000 mg | ORAL_CAPSULE | Freq: Three times a day (TID) | ORAL | 0 refills | Status: DC | PRN

## 2023-04-28 NOTE — Patient Instructions (Addendum)
Thank you for coming to the office today. Sinus Infection, Adult A sinus infection, also called sinusitis, is inflammation of your sinuses. Sinuses are hollow spaces in the bones around your face. Your sinuses are located: Around your eyes. In the middle of your forehead. Behind your nose. In your cheekbones. Mucus normally drains out of your sinuses. When your nasal tissues become inflamed or swollen, mucus can become trapped or blocked. This allows bacteria, viruses, and fungi to grow, which leads to infection. Most infections of the sinuses are caused by a virus. A sinus infection can develop quickly. It can last for up to 4 weeks (acute) or for more than 12 weeks (chronic). A sinus infection often develops after a cold. What are the causes? This condition is caused by anything that creates swelling in the sinuses or stops mucus from draining. This includes: Allergies. Asthma. Infection from bacteria or viruses. Deformities or blockages in your nose or sinuses. Abnormal growths in the nose (nasal polyps). Pollutants, such as chemicals or irritants in the air. Infection from fungi. This is rare. What increases the risk? You are more likely to develop this condition if you: Have a weak body defense system (immune system). Do a lot of swimming or diving. Overuse nasal sprays. Smoke. What are the signs or symptoms? The main symptoms of this condition are pain and a feeling of pressure around the affected sinuses. Other symptoms include: Stuffy nose or congestion that makes it difficult to breathe through your nose. Thick yellow or greenish drainage from your nose. Tenderness, swelling, and warmth over the affected sinuses. A cough that may get worse at night. Decreased sense of smell and taste. Extra mucus that collects in the throat or the back of the nose (postnasal drip) causing a sore throat or bad breath. Tiredness (fatigue). Fever. How is this diagnosed? This condition is  diagnosed based on: Your symptoms. Your medical history. A physical exam. Tests to find out if your condition is acute or chronic. This may include: Checking your nose for nasal polyps. Viewing your sinuses using a device that has a light (endoscope). Testing for allergies or bacteria. Imaging tests, such as an MRI or CT scan. In rare cases, a bone biopsy may be done to rule out more serious types of fungal sinus disease. How is this treated? Treatment for a sinus infection depends on the cause and whether your condition is chronic or acute. If caused by a virus, your symptoms should go away on their own within 10 days. You may be given medicines to relieve symptoms. They include: Medicines that shrink swollen nasal passages (decongestants). A spray that eases inflammation of the nostrils (topical intranasal corticosteroids). Rinses that help get rid of thick mucus in your nose (nasal saline washes). Medicines that treat allergies (antihistamines). Over-the-counter pain relievers. If caused by bacteria, your health care provider may recommend waiting to see if your symptoms improve. Most bacterial infections will get better without antibiotic medicine. You may be given antibiotics if you have: A severe infection. A weak immune system. If caused by narrow nasal passages or nasal polyps, surgery may be needed. Follow these instructions at home: Medicines Take, use, or apply over-the-counter and prescription medicines only as told by your health care provider. These may include nasal sprays. If you were prescribed an antibiotic medicine, take it as told by your health care provider. Do not stop taking the antibiotic even if you start to feel better. Hydrate and humidify  Drink enough fluid to keep  your urine pale yellow. Staying hydrated will help to thin your mucus. Use a cool mist humidifier to keep the humidity level in your home above 50%. Inhale steam for 10-15 minutes, 3-4 times a  day, or as told by your health care provider. You can do this in the bathroom while a hot shower is running. Limit your exposure to cool or dry air. Rest Rest as much as possible. Sleep with your head raised (elevated). Make sure you get enough sleep each night. General instructions  Apply a warm, moist washcloth to your face 3-4 times a day or as told by your health care provider. This will help with discomfort. Use nasal saline washes as often as told by your health care provider. Wash your hands often with soap and water to reduce your exposure to germs. If soap and water are not available, use hand sanitizer. Do not smoke. Avoid being around people who are smoking (secondhand smoke). Keep all follow-up visits. This is important. Contact a health care provider if: You have a fever. Your symptoms get worse. Your symptoms do not improve within 10 days. Get help right away if: You have a severe headache. You have persistent vomiting. You have severe pain or swelling around your face or eyes. You have vision problems. You develop confusion. Your neck is stiff. You have trouble breathing. These symptoms may be an emergency. Get help right away. Call 911. Do not wait to see if the symptoms will go away. Do not drive yourself to the hospital. Summary A sinus infection is soreness and inflammation of your sinuses. Sinuses are hollow spaces in the bones around your face. This condition is caused by nasal tissues that become inflamed or swollen. The swelling traps or blocks the flow of mucus. This allows bacteria, viruses, and fungi to grow, which leads to infection. If you were prescribed an antibiotic medicine, take it as told by your health care provider. Do not stop taking the antibiotic even if you start to feel better. Keep all follow-up visits. This is important. This information is not intended to replace advice given to you by your health care provider. Make sure you discuss any  questions you have with your health care provider. Document Revised: 10/05/2021 Document Reviewed: 10/05/2021 Elsevier Patient Education  2024 [employer] Inc.    Please schedule a Follow-up Appointment to: Return if symptoms worsen or fail to improve.  If you have any other questions or concerns, please feel free to call the office or send a message through MyChart. You may also schedule an earlier appointment if necessary.  Additionally, you may be receiving a survey about your experience at our office within a few days to 1 week by e-mail or mail. We value your feedback.  Saralyn Pilar, DO [facility], [place]
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
## 2023-04-28 NOTE — Progress Notes (Signed)
Subjective:    Patient ID: Bethany Ramsey, female    DOB: Jun 25, 1991, 32 y.o.   MRN: [id number]  Bethany Ramsey is a 32 y.o. female presenting on 04/28/2023 for Sinusitis  Patient presents for a same day appointment.  HPI  Sinusitis Reports was at beach, she said a "wave tuck her under", she has had difficulty with sore throat and dull pain lower in chest thick productive cough, cannot taste or smell due to congestion. Tried Mucinex some relief. Still coughing. No wheezing. No prior asthma. Denies fever or chills nausea vomiting   Home test COVID negative       04/28/2023   11:47 AM 11/24/2022   10:16 AM 08/11/2022    5:52 PM  Depression screen PHQ 2/9  Decreased Interest 0 0 1  Down, Depressed, Hopeless 0 0 1  PHQ - 2 Score 0 0 2  Altered sleeping 0 0 3  Tired, decreased energy 0 0 1  Change in appetite 0 0 1  Feeling bad or failure about yourself  0 0 0  Trouble concentrating 0 0 3  Moving slowly or fidgety/restless 0 0 0  Suicidal thoughts 0 0 0  PHQ-9 Score 0 0 10  Difficult doing work/chores Not difficult at all Not difficult at all Not difficult at all    Social History   Tobacco Use   Smoking status: Former    Packs/day: 0.50    Years: 8.00    Additional pack years: 0.00    Total pack years: 4.00    Types: Cigarettes    Quit date: 08/06/2019    Years since quitting: 3.7   Smokeless tobacco: Never  Vaping Use   Vaping Use: Every day   Start date: 08/06/2019   Last attempt to quit: 03/13/2021  Substance Use Topics   Alcohol use: No   Drug use: Not Currently    Types: Marijuana    Review of Systems Per HPI unless specifically indicated above     Objective:    BP 136/88 (BP Location: Left Arm, Patient Position: Sitting, Cuff Size: Normal)   Pulse 65   Temp (!) 96.5 F (35.8 C) (Temporal)   Wt 184 lb (83.5 kg)   SpO2 97%   BMI 27.98 kg/m   Wt Readings from Last 3 Encounters:  04/28/23 184 lb (83.5 kg)  12/26/22 174 lb 3.2 oz (79 kg)  11/24/22 174  lb 9.6 oz (79.2 kg)    Physical Exam Vitals and nursing note reviewed.  Constitutional:      General: She is not in acute distress.    Appearance: She is well-developed. She is not diaphoretic.     Comments: Well-appearing, comfortable, cooperative  HENT:     Head: Normocephalic and atraumatic.     Right Ear: Ear canal and external ear normal. There is no impacted cerumen.     Left Ear: Ear canal normal. There is no impacted cerumen.     Ears:     Comments: TM effusion bilateral Eyes:     General:        Right eye: No discharge.        Left eye: No discharge.     Conjunctiva/sclera: Conjunctivae normal.  Neck:     Thyroid: No thyromegaly.  Cardiovascular:     Rate and Rhythm: Normal rate and regular rhythm.     Heart sounds: Normal heart sounds. No murmur heard. Pulmonary:     Effort: Pulmonary effort is normal. No respiratory distress.  Breath sounds: Normal breath sounds. No wheezing or rales.  Musculoskeletal:        General: Normal range of motion.     Cervical back: Normal range of motion and neck supple.  Lymphadenopathy:     Cervical: No cervical adenopathy.  Skin:    General: Skin is warm and dry.     Findings: No erythema or rash.  Neurological:     Mental Status: She is alert and oriented to person, place, and time.  Psychiatric:        Behavior: Behavior normal.     Comments: Well groomed, good eye contact, normal speech and thoughts    Results for orders placed or performed in visit on 12/26/22  HIV Antibody (routine testing w rflx)  Result Value Ref Range   HIV 1&2 Ab, 4th Generation NON-REACTIVE NON-REACTIVE  RPR  Result Value Ref Range   RPR Ser Ql NON-REACTIVE NON-REACTIVE  Cervicovaginal ancillary only  Result Value Ref Range   Neisseria Gonorrhea Negative    Chlamydia Negative    Trichomonas Positive (A)    Bacterial Vaginitis (gardnerella) Negative    Candida Vaginitis Negative    Candida Glabrata Negative    Comment      Normal  Reference Range Bacterial Vaginosis - Negative   Comment Normal Reference Range Candida Species - Negative    Comment Normal Reference Range Candida Galbrata - Negative    Comment Normal Reference Range Trichomonas - Negative    Comment Normal Reference Ranger Chlamydia - Negative    Comment      Normal Reference Range Neisseria Gonorrhea - Negative      Assessment & Plan:   Problem List Items Addressed This Visit   None Visit Diagnoses     Acute non-recurrent maxillary sinusitis    -  Primary   Relevant Medications   amoxicillin-clavulanate (AUGMENTIN) 875-125 MG tablet   ipratropium (ATROVENT) 0.06 % nasal spray   benzonatate (TESSALON) 100 MG capsule   predniSONE (DELTASONE) 20 MG tablet      Consistent with acute maxillary sinusitis, likely initially viral URI vs allergic rhinitis component with worsening concern for bacterial infection.    Plan: 1.  Start Augmentin 875-125mg  PO BID x 10 days 2.  Start Atrovent nasal spray decongestant 2 sprays in each nostril up to 4 times daily for 7 days 3. Start Tessalon Perls take 1 capsule up to 3 times a day as needed for cough 4. Printed Prednisone taper if needed if not improving Future Chest x-ray if not improve Return criteria review    Meds ordered this encounter  Medications   amoxicillin-clavulanate (AUGMENTIN) 875-125 MG tablet    Sig: Take 1 tablet by mouth 2 (two) times daily.    Dispense:  20 tablet    Refill:  0   ipratropium (ATROVENT) 0.06 % nasal spray    Sig: Place 2 sprays into both nostrils 4 (four) times daily. For up to 5-7 days then stop.    Dispense:  15 mL    Refill:  0   benzonatate (TESSALON) 100 MG capsule    Sig: Take 1 capsule (100 mg total) by mouth 3 (three) times daily as needed for cough.    Dispense:  30 capsule    Refill:  0   predniSONE (DELTASONE) 20 MG tablet    Sig: Take daily with food. Start with 60mg  (3 pills) x 2 days, then reduce to 40mg  (2 pills) x 2 days, then 20mg  (1 pill) x 3  days    Dispense:  13 tablet    Refill:  0      Follow up plan: Return if symptoms worsen or fail to improve.   Saralyn Pilar, DO [facility] 04/28/2023, 11:54 AM

## 2023-05-01 ENCOUNTER — Telehealth: Payer: Self-pay | Admitting: Family Medicine

## 2023-05-01 NOTE — Telephone Encounter (Signed)
Pt was prescribed amoxicillin-clavulanate (AUGMENTIN) 875-125 MG tablet [[id number]] and per pt was told by Dr. Kirtland Bouchard if it didn't work to get the predniSONE (DELTASONE) 20 MG tablet [[id number]] filled and take that. Pt wants to know is she supposed to stop taking the Amoxicillin when she starts the Prednisone or is she supposed to take them both? Please advise.

## 2023-05-01 NOTE — Telephone Encounter (Signed)
She should take both medications.  Finish Augmentin and add the Prednisone.  Saralyn Pilar, DO [facility] 05/01/2023, 4:32 PM

## 2023-05-01 NOTE — Telephone Encounter (Signed)
Informed patient of Dr Althea Charon response.

## 2023-05-15 DIAGNOSIS — Z419 Encounter for procedure for purposes other than remedying health state, unspecified: Secondary | ICD-10-CM | POA: Diagnosis not present

## 2023-06-15 DIAGNOSIS — Z419 Encounter for procedure for purposes other than remedying health state, unspecified: Secondary | ICD-10-CM | POA: Diagnosis not present

## 2023-07-16 DIAGNOSIS — Z419 Encounter for procedure for purposes other than remedying health state, unspecified: Secondary | ICD-10-CM | POA: Diagnosis not present

## 2023-07-27 DIAGNOSIS — F900 Attention-deficit hyperactivity disorder, predominantly inattentive type: Secondary | ICD-10-CM | POA: Diagnosis not present

## 2023-08-15 DIAGNOSIS — Z419 Encounter for procedure for purposes other than remedying health state, unspecified: Secondary | ICD-10-CM | POA: Diagnosis not present

## 2023-09-15 DIAGNOSIS — Z419 Encounter for procedure for purposes other than remedying health state, unspecified: Secondary | ICD-10-CM | POA: Diagnosis not present

## 2023-09-22 ENCOUNTER — Ambulatory Visit
Admission: EM | Admit: 2023-09-22 | Discharge: 2023-09-22 | Disposition: A | Discharge status: Home / Self Care | Payer: Medicaid Other | Attending: Emergency Medicine | Admitting: Emergency Medicine

## 2023-09-22 ENCOUNTER — Encounter: Payer: Self-pay | Admitting: Emergency Medicine

## 2023-09-22 DIAGNOSIS — J069 Acute upper respiratory infection, unspecified: Secondary | ICD-10-CM | POA: Diagnosis not present

## 2023-09-22 DIAGNOSIS — F900 Attention-deficit hyperactivity disorder, predominantly inattentive type: Secondary | ICD-10-CM | POA: Diagnosis not present

## 2023-09-22 MED ORDER — PREDNISONE 20 MG PO TABS: 40.0000 mg | ORAL_TABLET | Freq: Every day | ORAL | 0 refills | Status: DC

## 2023-09-22 MED ORDER — DEXAMETHASONE SODIUM PHOSPHATE 10 MG/ML IJ SOLN
10.0000 mg | Freq: Once | INTRAMUSCULAR | Status: AC
  Administered 2023-09-22: 10 mg via INTRAMUSCULAR

## 2023-09-22 NOTE — ED Provider Notes (Addendum)
[facility]    CSN: [id number] Arrival date & time: 09/22/23  1757      History   Chief Complaint Chief Complaint  Patient presents with   Cough    HPI Bethany Ramsey is a 32 y.o. female.   Presents for evaluation of nasal congestion, rhinorrhea and a productive cough with green sputum present for 3 days.  Associated wheezing, pain with deep breathing and centralized back pain exacerbated by coughing.  Associated mild sore throat.  Decreased appetite but tolerating fluids.  No known sick contact prior.  Denies respiratory history, former cigarette use, current vape.  Has attempted use of Mucinex.    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 Authorizing Provider  buPROPion ER (WELLBUTRIN SR) 100 MG 12 hr tablet Take 1 tablet (100 mg total) by mouth 2 (two) times daily. 08/11/22  Yes Karamalegos, Alexander J, DO  SUBOXONE 8-2 MG FILM Place under the tongue daily. 03/11/21  Yes [provider]  amoxicillin-clavulanate (AUGMENTIN) 875-125 MG tablet Take 1 tablet by mouth 2 (two) times daily. 04/28/23   Karamalegos, Netta Neat, DO  atomoxetine (STRATTERA) 40 MG  capsule TAKE 1 CAPSULE(40 MG) BY MOUTH DAILY Patient not taking: Reported on 04/28/2023 09/08/22   Smitty Cords, DO  baclofen (LIORESAL) 10 MG tablet Take 0.5-1 tablets (5-10 mg total) by mouth 3 (three) times daily as needed for muscle spasms. Patient not taking: Reported on 04/28/2023 11/24/22   Smitty Cords, DO  benzonatate (TESSALON) 100 MG capsule Take 1 capsule (100 mg total) by mouth 3 (three) times daily as needed for cough. 04/28/23   Karamalegos, Netta Neat, DO  clobetasol (TEMOVATE) 0.05 % external solution Apply 1 application topically 2 (two) times daily as needed (scalp itching psoriasis flare). 05/25/21   Karamalegos, Netta Neat, DO  ibuprofen (ADVIL) 600 MG tablet Take 1 tablet (600 mg total) by mouth every 6 (six) hours as needed for moderate pain. Patient not taking: Reported on 04/28/2023 11/18/22   Bridget Hartshorn L, PA-C  ipratropium (ATROVENT) 0.06 % nasal spray Place 2 sprays into both nostrils 4 (four) times daily. For up to 5-7 days then stop. 04/28/23   Karamalegos, Netta Neat, DO  predniSONE (DELTASONE) 20 MG tablet Take daily with food. Start with 60mg  (3 pills) x 2 days, then reduce to 40mg  (2 pills) x 2 days, then  20mg  (1 pill) x 3 days 04/28/23   Smitty Cords, DO    Family History Family History  Problem Relation Age of Onset   Psoriasis Sister    Hypertension Maternal Grandmother    Stroke Maternal Grandmother    Psoriasis Paternal Grandfather     Social History Social History   Tobacco Use   Smoking status: Former    Current packs/day: 0.00    Average packs/day: 0.5 packs/day for 8.0 years (4.0 ttl pk-yrs)    Types: Cigarettes    Start date: 08/06/2011    Quit date: 08/06/2019    Years since quitting: 4.1   Smokeless tobacco: Never  Vaping Use   Vaping status: Every Day   Start date: 08/06/2019   Last attempt to quit: 03/13/2021  Substance Use Topics   Alcohol use: No   Drug use: Not Currently    Types: Marijuana      Allergies   Patient has no known allergies.   Review of Systems Review of Systems   Physical Exam Triage Vital Signs ED Triage Vitals  Encounter Vitals Group     BP 09/22/23 1816 (!) 144/92     Systolic BP Percentile --      Diastolic BP Percentile --      Pulse Rate 09/22/23 1816 70     Resp 09/22/23 1816 15     Temp 09/22/23 1816 98.4 F (36.9 C)     Temp Source 09/22/23 1816 Oral     SpO2 09/22/23 1816 95 %     Weight 09/22/23 1814 190 lb (86.2 kg)     Height 09/22/23 1814 5\' 8"  (1.727 m)     Head Circumference --      Peak Flow --      Pain Score 09/22/23 1814 0     Pain Loc --      Pain Education --      Exclude from Growth Chart --    No data found.  Updated Vital Signs BP (!) 144/92 (BP Location: Right Arm)   Pulse 70   Temp 98.4 F (36.9 C) (Oral)   Resp 15   Ht 5\' 8"  (1.727 m)   Wt 190 lb (86.2 kg)   LMP 08/23/2023   SpO2 95%   Breastfeeding No   BMI 28.89 kg/m   Visual Acuity Right Eye Distance:   Left Eye Distance:   Bilateral Distance:    Right Eye Near:   Left Eye Near:    Bilateral Near:     Physical Exam Constitutional:      Appearance: Normal appearance.  HENT:     Right Ear: Tympanic membrane, ear canal and external ear normal.     Left Ear: Tympanic membrane, ear canal and external ear normal.     Nose: Congestion and rhinorrhea present.     Mouth/Throat:     Mouth: Mucous membranes are moist.     Pharynx: Oropharynx is clear. No oropharyngeal exudate or posterior oropharyngeal erythema.  Eyes:     Extraocular Movements: Extraocular movements intact.  Cardiovascular:     Rate and Rhythm: Normal rate and regular rhythm.     Pulses: Normal pulses.     Heart sounds: Normal heart sounds.  Pulmonary:     Effort: Pulmonary effort is normal.     Breath sounds: Wheezing present.  Neurological:     Mental Status: She is alert and oriented to person, place, and time. Mental status is at baseline.  UC Treatments /  Results  Labs (all labs ordered are listed, but only abnormal results are displayed) Labs Reviewed - No data to display  EKG   Radiology No results found.  Procedures Procedures (including critical care time)  Medications Ordered in UC Medications - No data to display  Initial Impression / Assessment and Plan / UC Course  I have reviewed the triage vital signs and the nursing notes.  Pertinent labs & imaging results that were available during my care of the patient were reviewed by me and considered in my medical decision making (see chart for details).  Viral URI with cough  Patient is in no signs of distress nor toxic appearing.  Vital signs are stable.  Low suspicion for pneumonia, pneumothorax or bronchitis and therefore will defer imaging.  Wheezing heard to auscultation, O2 saturation 95% on room air, stable for outpatient management.  Decadron IM given and prescribed prednisone for outpatient use.endorses availability of Tessalon for home use.   May use additional over-the-counter medications as needed for supportive care.  May follow-up with urgent care as needed if symptoms persist or worsen.   Final Clinical Impressions(s) / UC Diagnoses   Final diagnoses:  None   Discharge Instructions   None    ED Prescriptions   None    PDMP not reviewed this encounter.   Valinda Hoar, NP 09/22/23 1901    Valinda Hoar, NP 09/22/23 1902

## 2023-09-22 NOTE — Discharge Instructions (Signed)
Your symptoms today are most likely being caused by a virus and should steadily improve in time it can take up to 7 to 10 days before you truly start to see a turnaround however things will get better  On exam her lungs are clear but able to hear wheezing with coughing  Has been given a dose of Decadron which is a steroid to open and relax airway, starting tomorrow take prednisone every morning as directed to continue to keep the airway open  May continue use of Tylenol and Delsym as needed, may attempt any of the following below    You can take Tylenol and/or Ibuprofen as needed for fever reduction and pain relief.   For cough: honey 1/2 to 1 teaspoon (you can dilute the honey in water or another fluid).  You can also use guaifenesin and dextromethorphan for cough. You can use a humidifier for chest congestion and cough.  If you don't have a humidifier, you can sit in the bathroom with the hot shower running.      For sore throat: try warm salt water gargles, cepacol lozenges, throat spray, warm tea or water with lemon/honey, popsicles or ice, or OTC cold relief medicine for throat discomfort.   For congestion: take a daily anti-histamine like Zyrtec, Claritin, and a oral decongestant, such as pseudoephedrine.  You can also use Flonase 1-2 sprays in each nostril daily.   It is important to stay hydrated: drink plenty of fluids (water, gatorade/powerade/pedialyte, juices, or teas) to keep your throat moisturized and help further relieve irritation/discomfort.

## 2023-09-22 NOTE — ED Triage Notes (Signed)
Patient c/o cough, chest congestion, SOB, and nasal congestion that started 3 days ago.  Patient denies fevers.

## 2023-10-15 DIAGNOSIS — Z419 Encounter for procedure for purposes other than remedying health state, unspecified: Secondary | ICD-10-CM | POA: Diagnosis not present

## 2023-10-20 DIAGNOSIS — F900 Attention-deficit hyperactivity disorder, predominantly inattentive type: Secondary | ICD-10-CM | POA: Diagnosis not present

## 2023-10-26 ENCOUNTER — Encounter (INDEPENDENT_AMBULATORY_CARE_PROVIDER_SITE_OTHER): Payer: Self-pay | Admitting: Family Medicine

## 2023-10-26 DIAGNOSIS — Z87898 Personal history of other specified conditions: Secondary | ICD-10-CM | POA: Diagnosis not present

## 2023-10-26 DIAGNOSIS — R110 Nausea: Secondary | ICD-10-CM | POA: Diagnosis not present

## 2023-10-27 MED ORDER — ONDANSETRON 4 MG PO TBDP: 4.0000 mg | ORAL_TABLET | Freq: Three times a day (TID) | ORAL | 2 refills | Status: DC | PRN

## 2023-10-27 NOTE — Telephone Encounter (Signed)
Please see the MyChart message reply(ies) for my assessment and plan.    This patient gave consent for this Medical Advice Message and is aware that it may result in a bill to their insurance company, as well as the possibility of receiving a bill for a co-payment or deductible. They are an established patient, but are not seeking medical advice exclusively about a problem treated during an in person or video visit in the last seven days. I did not recommend an in person or video visit within seven days of my reply.    I spent a total of 5 minutes cumulative time within 7 days through MyChart messaging.  Axelle Szwed, DO   

## 2023-11-15 DIAGNOSIS — Z419 Encounter for procedure for purposes other than remedying health state, unspecified: Secondary | ICD-10-CM | POA: Diagnosis not present

## 2023-12-16 DIAGNOSIS — Z419 Encounter for procedure for purposes other than remedying health state, unspecified: Secondary | ICD-10-CM | POA: Diagnosis not present

## 2023-12-22 DIAGNOSIS — F900 Attention-deficit hyperactivity disorder, predominantly inattentive type: Secondary | ICD-10-CM | POA: Diagnosis not present

## 2024-01-13 DIAGNOSIS — Z419 Encounter for procedure for purposes other than remedying health state, unspecified: Secondary | ICD-10-CM | POA: Diagnosis not present

## 2024-01-22 ENCOUNTER — Ambulatory Visit (INDEPENDENT_AMBULATORY_CARE_PROVIDER_SITE_OTHER): Admitting: Family Medicine

## 2024-01-22 ENCOUNTER — Encounter: Payer: Self-pay | Admitting: Family Medicine

## 2024-01-22 VITALS — BP 130/80 | HR 60 | Ht 68.0 in | Wt 184.0 lb

## 2024-01-22 DIAGNOSIS — D485 Neoplasm of uncertain behavior of skin: Secondary | ICD-10-CM

## 2024-01-22 NOTE — Progress Notes (Signed)
 Subjective:    Patient ID: Bethany Ramsey, female    DOB: 20-Dec-1990, 33 y.o.   MRN: [id number]  Bethany Ramsey is a 33 y.o. female presenting on 01/22/2024 for skin lesion   HPI  Discussed the use of AI scribe software for clinical note transcription with the patient, who gave verbal consent to proceed.  History of Present Illness   She presents with a spot on her face that has changed in size and color.  The spot on her face appeared a few years ago and initially resembled its current appearance. Last summer, after experiencing a sunburn, she accidentally scraped the spot off. It healed but grew back larger and darker. The spot has since doubled in size and now has a darker brown color  She experiences occasional itching at the site of the spot and has other sunspots on her face. She does not use any specific topical treatments for the spot but moisturizes her face with a good quality moisturizer.  There is no family history of skin cancer, although both of her parents have very fair skin. Her father has sunspots, and she mentions that she and her father have similar spots in the same location on their faces.           01/22/2024    2:23 PM 04/28/2023   11:47 AM 11/24/2022   10:16 AM  Depression screen PHQ 2/9  Decreased Interest 0 0 0  Down, Depressed, Hopeless 0 0 0  PHQ - 2 Score 0 0 0  Altered sleeping 0 0 0  Tired, decreased energy 0 0 0  Change in appetite 0 0 0  Feeling bad or failure about yourself  0 0 0  Trouble concentrating 0 0 0  Moving slowly or fidgety/restless 0 0 0  Suicidal thoughts 0 0 0  PHQ-9 Score 0 0 0  Difficult doing work/chores Not difficult at all Not difficult at all Not difficult at all       01/22/2024    2:23 PM 04/28/2023   11:47 AM 11/24/2022   10:16 AM 08/11/2022    5:52 PM  GAD 7 : Generalized Anxiety Score  Nervous, Anxious, on Edge 0 0 0 3  Control/stop worrying 0 0 0 1  Worry too much - different things 0 0 0 2  Trouble relaxing 0 0 0  2  Restless 0 0 0 1  Easily annoyed or irritable 0 0 0 3  Afraid - awful might happen 0 0 0 1  Total GAD 7 Score 0 0 0 13  Anxiety Difficulty Not difficult at all Not difficult at all Not difficult at all Somewhat difficult    Social History   Tobacco Use   Smoking status: Former    Current packs/day: 0.00    Average packs/day: 0.5 packs/day for 8.0 years (4.0 ttl pk-yrs)    Types: Cigarettes    Start date: 08/06/2011    Quit date: 08/06/2019    Years since quitting: 4.4   Smokeless tobacco: Never  Vaping Use   Vaping status: Every Day   Start date: 08/06/2019   Last attempt to quit: 03/13/2021  Substance Use Topics   Alcohol use: No   Drug use: Not Currently    Types: Marijuana    Review of Systems Per HPI unless specifically indicated above     Objective:    BP 130/80   Pulse 60   Ht 5\' 8"  (1.727 m)   Wt 184 lb (83.5 kg)  SpO2 99%   BMI 27.98 kg/m   Wt Readings from Last 3 Encounters:  01/22/24 184 lb (83.5 kg)  09/22/23 190 lb (86.2 kg)  04/28/23 184 lb (83.5 kg)    Physical Exam Vitals and nursing note reviewed.  Constitutional:      General: She is not in acute distress.    Appearance: Normal appearance. She is well-developed. She is not diaphoretic.     Comments: Well-appearing, comfortable, cooperative  HENT:     Head: Normocephalic and atraumatic.  Eyes:     General:        Right eye: No discharge.        Left eye: No discharge.     Conjunctiva/sclera: Conjunctivae normal.  Cardiovascular:     Rate and Rhythm: Normal rate.  Pulmonary:     Effort: Pulmonary effort is normal.  Skin:    General: Skin is warm and dry.     Findings: Lesion (Left temple facial region 2 skin lesions, one in question is about 3 mm size) present. No erythema or rash.  Neurological:     Mental Status: She is alert and oriented to person, place, and time.  Psychiatric:        Mood and Affect: Mood normal.        Behavior: Behavior normal.        Thought Content:  Thought content normal.     Comments: Well groomed, good eye contact, normal speech and thoughts     Left Face / Temple region skin lesion in question      Results for orders placed or performed in visit on 12/26/22  Cervicovaginal ancillary only   Collection Time: 12/26/22  2:28 PM  Result Value Ref Range   Neisseria Gonorrhea Negative    Chlamydia Negative    Trichomonas Positive (A)    Bacterial Vaginitis (gardnerella) Negative    Candida Vaginitis Negative    Candida Glabrata Negative    Comment      Normal Reference Range Bacterial Vaginosis - Negative   Comment Normal Reference Range Candida Species - Negative    Comment Normal Reference Range Candida Galbrata - Negative    Comment Normal Reference Range Trichomonas - Negative    Comment Normal Reference Ranger Chlamydia - Negative    Comment      Normal Reference Range Neisseria Gonorrhea - Negative  HIV Antibody (routine testing w rflx)   Collection Time: 12/26/22  2:39 PM  Result Value Ref Range   HIV 1&2 Ab, 4th Generation NON-REACTIVE NON-REACTIVE  RPR   Collection Time: 12/26/22  2:39 PM  Result Value Ref Range   RPR Ser Ql NON-REACTIVE NON-REACTIVE      Assessment & Plan:   Problem List Items Addressed This Visit   None Visit Diagnoses       Neoplasm of uncertain behavior of skin    -  Primary   Relevant Orders   Ambulatory referral to Dermatology        Facial Lesion A raised, firm, darkened lesion on the face, present for a few years, which was scraped off after a sunburn last summer and has since regrown to a larger size. Itching reported. No family history of skin cancer. Differential diagnosis includes seborrheic keratosis, cyst, or scar tissue. -Refer to dermatology for further evaluation and potential cryotherapy. -If appointment wait time is long, consider use of topical scar cream or silicone cap for moisturizing and raised scar reduction.        Orders Placed  This Encounter   Procedures   Ambulatory referral to Dermatology    Referral Priority:   Routine    Referral Type:   Consultation    Referral Reason:   Specialty Services Required    Requested Specialty:   Dermatology    Number of Visits Requested:   1    No orders of the defined types were placed in this encounter.   Follow up plan: Return if symptoms worsen or fail to improve.   Saralyn Pilar, DO [facility] 01/22/2024, 2:32 PM

## 2024-01-22 NOTE — Patient Instructions (Addendum)
 Thank you for coming to the office today.  Referral to Dermatology - stay tuned for apt. If not heard back in 2 weeks, message or call us to find out status.  If waiting a while, can try a topical scar cream / or silicone pad for moisturizing and raised scar reduction.  I don't believe it is any harm to you or any pre cancer or cancer.  They may use cryotherapy or freeze it.  [facility] Dermatologist in [place], [place] Address: [address], [place], [place] [address] Phone: [phone]  [facility] [address] [place], [place] [address] Phone: [phone]  Please schedule a Follow-up Appointment to: Return if symptoms worsen or fail to improve.  If you have any other questions or concerns, please feel free to call the office or send a message through MyChart. You may also schedule an earlier appointment if necessary.  Additionally, you may be receiving a survey about your experience at our office within a few days to 1 week by e-mail or mail. We value your feedback.  Saralyn Pilar, DO [facility], [place]

## 2024-02-23 DIAGNOSIS — F331 Major depressive disorder, recurrent, moderate: Secondary | ICD-10-CM | POA: Diagnosis not present

## 2024-02-23 DIAGNOSIS — F900 Attention-deficit hyperactivity disorder, predominantly inattentive type: Secondary | ICD-10-CM | POA: Diagnosis not present

## 2024-02-24 DIAGNOSIS — Z419 Encounter for procedure for purposes other than remedying health state, unspecified: Secondary | ICD-10-CM | POA: Diagnosis not present

## 2024-03-04 DIAGNOSIS — D2262 Melanocytic nevi of left upper limb, including shoulder: Secondary | ICD-10-CM | POA: Diagnosis not present

## 2024-03-04 DIAGNOSIS — D2271 Melanocytic nevi of right lower limb, including hip: Secondary | ICD-10-CM | POA: Diagnosis not present

## 2024-03-04 DIAGNOSIS — D225 Melanocytic nevi of trunk: Secondary | ICD-10-CM | POA: Diagnosis not present

## 2024-03-04 DIAGNOSIS — D2261 Melanocytic nevi of right upper limb, including shoulder: Secondary | ICD-10-CM | POA: Diagnosis not present

## 2024-03-04 DIAGNOSIS — L814 Other melanin hyperpigmentation: Secondary | ICD-10-CM | POA: Diagnosis not present

## 2024-03-04 DIAGNOSIS — D485 Neoplasm of uncertain behavior of skin: Secondary | ICD-10-CM | POA: Diagnosis not present

## 2024-03-04 DIAGNOSIS — C44319 Basal cell carcinoma of skin of other parts of face: Secondary | ICD-10-CM | POA: Diagnosis not present

## 2024-03-04 DIAGNOSIS — D2272 Melanocytic nevi of left lower limb, including hip: Secondary | ICD-10-CM | POA: Diagnosis not present

## 2024-03-10 ENCOUNTER — Other Ambulatory Visit: Payer: Self-pay

## 2024-03-10 ENCOUNTER — Emergency Department

## 2024-03-10 ENCOUNTER — Emergency Department
Admission: EM | Admit: 2024-03-10 | Discharge: 2024-03-10 | Disposition: A | Discharge status: Home / Self Care | Attending: Emergency Medicine | Admitting: Emergency Medicine

## 2024-03-10 ENCOUNTER — Encounter: Payer: Self-pay | Admitting: Emergency Medicine

## 2024-03-10 DIAGNOSIS — R112 Nausea with vomiting, unspecified: Secondary | ICD-10-CM | POA: Insufficient documentation

## 2024-03-10 DIAGNOSIS — R6883 Chills (without fever): Secondary | ICD-10-CM | POA: Diagnosis not present

## 2024-03-10 DIAGNOSIS — R1110 Vomiting, unspecified: Secondary | ICD-10-CM | POA: Diagnosis not present

## 2024-03-10 DIAGNOSIS — R10A0 Flank pain, unspecified side: Secondary | ICD-10-CM

## 2024-03-10 DIAGNOSIS — R079 Chest pain, unspecified: Secondary | ICD-10-CM | POA: Diagnosis not present

## 2024-03-10 DIAGNOSIS — R1011 Right upper quadrant pain: Secondary | ICD-10-CM | POA: Diagnosis not present

## 2024-03-10 DIAGNOSIS — R109 Unspecified abdominal pain: Secondary | ICD-10-CM | POA: Diagnosis not present

## 2024-03-10 DIAGNOSIS — R7989 Other specified abnormal findings of blood chemistry: Secondary | ICD-10-CM | POA: Diagnosis not present

## 2024-03-10 DIAGNOSIS — R0789 Other chest pain: Secondary | ICD-10-CM | POA: Diagnosis not present

## 2024-03-10 LAB — URINALYSIS, W/ REFLEX TO CULTURE (INFECTION SUSPECTED)
Bacteria, UA: NONE SEEN
Bilirubin Urine: NEGATIVE
Glucose, UA: NEGATIVE mg/dL
Hgb urine dipstick: NEGATIVE
Ketones, ur: NEGATIVE mg/dL
Leukocytes,Ua: NEGATIVE
Nitrite: NEGATIVE
Protein, ur: NEGATIVE mg/dL
RBC / HPF: 0 RBC/hpf (ref 0–5)
Specific Gravity, Urine: 1.008 (ref 1.005–1.030)
pH: 8 (ref 5.0–8.0)

## 2024-03-10 LAB — BASIC METABOLIC PANEL WITH GFR
Anion gap: 6 (ref 5–15)
BUN: 16 mg/dL (ref 6–20)
CO2: 26 mmol/L (ref 22–32)
Calcium: 9.1 mg/dL (ref 8.9–10.3)
Chloride: 103 mmol/L (ref 98–111)
Creatinine, Ser: 0.87 mg/dL (ref 0.44–1.00)
GFR, Estimated: >60 mL/min (ref >60)
Glucose, Bld: 115 mg/dL — ABNORMAL HIGH (ref 70–99)
Potassium: 3.8 mmol/L (ref 3.5–5.1)
Sodium: 135 mmol/L (ref 135–145)

## 2024-03-10 LAB — HEPATIC FUNCTION PANEL
ALT: 26 U/L (ref 0–44)
AST: 25 U/L (ref 15–41)
Albumin: 4.2 g/dL (ref 3.5–5.0)
Alkaline Phosphatase: 35 U/L — ABNORMAL LOW (ref 38–126)
Bilirubin, Direct: 0.2 mg/dL (ref 0.0–0.2)
Indirect Bilirubin: 1.5 mg/dL — ABNORMAL HIGH (ref 0.3–0.9)
Total Bilirubin: 1.7 mg/dL — ABNORMAL HIGH (ref 0.0–1.2)
Total Protein: 6.8 g/dL (ref 6.5–8.1)

## 2024-03-10 LAB — CBC
HCT: 39.2 % (ref 36.0–46.0)
Hemoglobin: 13.1 g/dL (ref 12.0–15.0)
MCH: 31.2 pg (ref 26.0–34.0)
MCHC: 33.4 g/dL (ref 30.0–36.0)
MCV: 93.3 fL (ref 80.0–100.0)
Platelets: 182 10*3/uL (ref 150–400)
RBC: 4.2 MIL/uL (ref 3.87–5.11)
RDW: 12.4 % (ref 11.5–15.5)
WBC: 5.1 10*3/uL (ref 4.0–10.5)
nRBC: 0 % (ref 0.0–0.2)

## 2024-03-10 LAB — TROPONIN I (HIGH SENSITIVITY): Troponin I (High Sensitivity): 2 ng/L (ref <18)

## 2024-03-10 LAB — HCG, QUANTITATIVE, PREGNANCY: hCG, Beta Chain, Quant, S: <1 m[IU]/mL (ref <5)

## 2024-03-10 LAB — LIPASE, BLOOD: Lipase: 29 U/L (ref 11–51)

## 2024-03-10 MED ORDER — LIDOCAINE 5 % EX PTCH: 1.0000 | MEDICATED_PATCH | CUTANEOUS | 0 refills | Status: AC

## 2024-03-10 MED ORDER — ONDANSETRON HCL 4 MG/2ML IJ SOLN
4.0000 mg | Freq: Once | INTRAMUSCULAR | Status: AC
  Administered 2024-03-10: 4 mg via INTRAVENOUS
  Filled 2024-03-10: qty 2

## 2024-03-10 MED ORDER — KETOROLAC TROMETHAMINE 15 MG/ML IJ SOLN
15.0000 mg | Freq: Once | INTRAMUSCULAR | Status: AC
  Administered 2024-03-10: 15 mg via INTRAVENOUS
  Filled 2024-03-10: qty 1

## 2024-03-10 MED ORDER — LACTATED RINGERS IV BOLUS
1000.0000 mL | Freq: Once | INTRAVENOUS | Status: AC
  Administered 2024-03-10: 1000 mL via INTRAVENOUS

## 2024-03-10 NOTE — Discharge Instructions (Addendum)
 Please follow-up with your primary care doctor for further management of your symptoms, you can use Tylenol  or ibuprofen  as needed for the pain.  He can also use Lidoderm  patches.  Please also get repeat labs in about a week to assess your liver function.

## 2024-03-10 NOTE — ED Triage Notes (Signed)
 Patient to ED via POV for right sided flank pain since Tuesday. States episode of vomiting and having chills. States pain is worse after eating. Intermittent CP- none at this time. States she has been having urinary frequency.

## 2024-03-10 NOTE — ED Provider Notes (Signed)
 [facility] Provider Note    Event Date/Time   First MD Initiated Contact with Patient 03/10/24 [phone]     (approximate)   History   Flank Pain   HPI  Quashonda Fantin is a 33 y.o. female with history of bipolar disorder, opiate dependence, on Suboxone , presenting with nausea vomiting, right flank pain since Tuesday.  Symptoms have been intermittent, states that is also feels like under her right rib cage.  States that with the nausea vomiting, she has some chest pain and burning sensation in her throat.  No fevers.  No shortness of breath or cough.  No urinary symptoms.  No diarrhea.  No prior history of kidney stones.   On independent review, for med list, she is on Suboxone , Wellbutrin .  Physical Exam   Triage Vital Signs: ED Triage Vitals  Encounter Vitals Group     BP 03/10/24 0857 (!) 185/111     Systolic BP Percentile --      Diastolic BP Percentile --      Pulse Rate 03/10/24 0857 71     Resp 03/10/24 0857 17     Temp 03/10/24 0857 98.5 F (36.9 C)     Temp Source 03/10/24 0857 Oral     SpO2 03/10/24 0857 100 %     Weight 03/10/24 0858 175 lb (79.4 kg)     Height 03/10/24 0858 5\' 9"  (1.753 m)     Head Circumference --      Peak Flow --      Pain Score 03/10/24 0857 6     Pain Loc --      Pain Education --      Exclude from Growth Chart --     Most recent vital signs: Vitals:   03/10/24 1100 03/10/24 1327  BP: 119/83   Pulse: 60   Resp: 17   Temp:  98.6 F (37 C)  SpO2: 100%      General: Awake, no distress.  CV:  Good peripheral perfusion.  Resp:  Normal effort.  No thoracic cage tenderness Abd:  No distention.  Abdomen soft nontender in the right upper quadrant Other:  No CVA tenderness, no flank tenderness.   ED Results / Procedures / Treatments   Labs (all labs ordered are listed, but only abnormal results are displayed) Labs Reviewed  BASIC METABOLIC PANEL WITH GFR - Abnormal; Notable for the following components:       Result Value   Glucose, Bld 115 (*)    All other components within normal limits  URINALYSIS, W/ REFLEX TO CULTURE (INFECTION SUSPECTED) - Abnormal; Notable for the following components:   Color, Urine STRAW (*)    APPearance CLEAR (*)    All other components within normal limits  HEPATIC FUNCTION PANEL - Abnormal; Notable for the following components:   Alkaline Phosphatase 35 (*)    Total Bilirubin 1.7 (*)    Indirect Bilirubin 1.5 (*)    All other components within normal limits  CBC  LIPASE, BLOOD  HCG, QUANTITATIVE, PREGNANCY  PREGNANCY, URINE  TROPONIN I (HIGH SENSITIVITY)  TROPONIN I (HIGH SENSITIVITY)     EKG  Independent review of labs, sinus rhythm with sinus arrhythmia, rate 77, normal QRS, normal QTc, no ischemic ST elevation, T wave flattening in aVL, V3, this is changed compared to prior   RADIOLOGY Right upper quadrant ultrasound on my independent interpretation without obvious gallstones or cholecystitis   PROCEDURES:  Critical Care performed: No  Procedures  MEDICATIONS ORDERED IN ED: Medications  ketorolac  (TORADOL ) 15 MG/ML injection 15 mg (has no administration in time range)  lactated ringers  bolus 1,000 mL (0 mLs Intravenous Stopped 03/10/24 1247)  ondansetron  (ZOFRAN ) injection 4 mg (4 mg Intravenous Given 03/10/24 1005)     IMPRESSION / MDM / ASSESSMENT AND PLAN / ED COURSE  I reviewed the triage vital signs and the nursing notes.                              Differential diagnosis includes, but is not limited to, nephrolithiasis, UTI, pyelonephritis, viral illness, gastroenteritis, did considered biliary etiology but patient has no right upper quadrant abdominal pain on exam.  Suspect chest pain is secondary to her vomiting and possible heartburn, did consider ACS, will get labs, EKG, troponin, chest x-ray, CT.  Give her some fluids, IV Zofran , will give her IV Toradol  after getting her pregnancy test back.  Patient's presentation is most  consistent with acute presentation with potential threat to life or bodily function.  Independent review of labs imaging below.  Shared decision making done with patient and she is agreeable plan for discharge, considered but no indication for inpatient admission at this time, she is safe for outpatient management.  Recommended Tylenol  and ibuprofen  for home, will give her a prescription for Lidoderm  patches.  She has a primary care doctor that she can follow-up with, discussed incidental findings in labs and/or imaging results with patient and she will follow-up.  Discharged with strict return precautions.  Clinical Course as of 03/10/24 1351  Sun Mar 10, 2024  0934 DG Chest 2 View IMPRESSION: No acute cardiopulmonary disease.  Minimal exclusion of the inferior right lateral right hemithorax.   [TT]  1228 US  ABDOMEN LIMITED RUQ (LIVER/GB) Normal right upper quadrant ultrasound.  [TT]  1311 Urinalysis, w/ Reflex to Culture (Infection Suspected) -Urine, Clean Catch(!) Negative for UTI. [TT]  1311 Independent review of labs, hCG is negative, lipase is normal, troponins not elevated, electrolytes are severely deranged, alk phos is mildly elevated, T. bili is mildly elevated,, will get a right upper quadrant ultrasound, she has no leukocytosis. [TT]  1345 CT ABDOMEN PELVIS WO CONTRAST Unremarkable examination of the abdomen and pelvis.  [TT]  1345 Urinalysis, w/ Reflex to Culture (Infection Suspected) -Urine, Clean Catch(!) Negative for UTI. [TT]    Clinical Course User Index [TT] Shane Darling, MD     FINAL CLINICAL IMPRESSION(S) / ED DIAGNOSES   Final diagnoses:  Flank pain  Chest pain, unspecified type  Elevated LFTs     Rx / DC Orders   ED Discharge Orders          Ordered    lidocaine  (LIDODERM ) 5 %  Every 24 hours        03/10/24 1351             Note:  This document was prepared using Dragon voice recognition software and may include unintentional dictation  errors.    Shane Darling, MD 03/10/24 [phone]

## 2024-03-10 NOTE — ED Notes (Signed)
 Urine sent

## 2024-03-15 ENCOUNTER — Encounter: Payer: Self-pay | Admitting: Family Medicine

## 2024-03-15 ENCOUNTER — Ambulatory Visit (INDEPENDENT_AMBULATORY_CARE_PROVIDER_SITE_OTHER): Admitting: Family Medicine

## 2024-03-15 VITALS — BP 130/82 | HR 87 | Ht 69.0 in | Wt 172.2 lb

## 2024-03-15 DIAGNOSIS — M940 Chondrocostal junction syndrome [Tietze]: Secondary | ICD-10-CM

## 2024-03-15 DIAGNOSIS — R109 Unspecified abdominal pain: Secondary | ICD-10-CM | POA: Diagnosis not present

## 2024-03-15 DIAGNOSIS — Z87898 Personal history of other specified conditions: Secondary | ICD-10-CM | POA: Diagnosis not present

## 2024-03-15 DIAGNOSIS — R110 Nausea: Secondary | ICD-10-CM

## 2024-03-15 DIAGNOSIS — K219 Gastro-esophageal reflux disease without esophagitis: Secondary | ICD-10-CM | POA: Diagnosis not present

## 2024-03-15 MED ORDER — ONDANSETRON 4 MG PO TBDP: 4.0000 mg | ORAL_TABLET | Freq: Three times a day (TID) | ORAL | 2 refills | Status: AC | PRN

## 2024-03-15 MED ORDER — OMEPRAZOLE 40 MG PO CPDR: 40.0000 mg | DELAYED_RELEASE_CAPSULE | Freq: Every day | ORAL | 0 refills | Status: AC

## 2024-03-15 MED ORDER — NAPROXEN 500 MG PO TABS: 500.0000 mg | ORAL_TABLET | Freq: Two times a day (BID) | ORAL | 0 refills | Status: AC

## 2024-03-15 NOTE — Progress Notes (Signed)
 Subjective:    Patient ID: Bethany Ramsey, female    DOB: 1991/05/21, 33 y.o.   MRN: [id number]  Bethany Ramsey is a 33 y.o. female presenting on 03/15/2024 for Nausea, Rib vs Flank Pain R side costochondritis   HPI  Discussed the use of AI scribe software for clinical note transcription with the patient, who gave verbal consent to proceed.  History of Present Illness   Bethany Ramsey is a 33 year old female who presents with right wrist pain following an ER visit.  ED FOLLOW-UP VISIT  Hospital/Location: [facility] Date of ED Visit: 03/10/24  Reason for Presenting to ED: R side pain, Nausea  FOLLOW-UP  - ED provider note and record have been reviewed - Patient presents today about 5 days after recent ED visit.    She experiences sharp, stabbing pain under her R ribs, described as intermittent and random, sometimes becoming dull and central. A similar pain was present on prior episode in 2017 resolved w/ NSAIDs  Now current episode recent ER visit due to severe pain included an ultrasound and CT scan, which showed no gallstones or significant findings except for slightly elevated liver enzymes. She received a Toradol  shot at the ER, which significantly reduced the sharp pain, although she still experiences a throbbing sensation internally.  She feels generally unwell, with decreased appetite and nausea. She has been taking Zofran  for nausea, which she finds effective. She also has a history of motion sickness, particularly when not driving. Her current medications include Suboxone , Wellbutrin , and Trintellix, the latter of which she started over a week ago. She reports feeling better mentally on Trintellix but has experienced nausea, a known side effect of the medication.  Her family history includes her sister having had her gallbladder removed. Socially, she is a single mother of a twelve-year-old and has five animals, which contributes to her stress levels. She works and attends school, adding to  her busy lifestyle. Admits stressors  During the review of symptoms, she reports no significant changes in bowel habits, although she experiences constipation due to Suboxone . She reports occasional heartburn but no significant indigestion linked to food. She denies any urinary symptoms or significant changes in urine color.   She has seen Dermatologist treated Basal Cell Carcinomina, had shave biopsy, then will have an excision.  Followed by [facility] - Had mental breakdown, placed on Pristiq had side effect. Stopped. Switched to Trintellix now improved but this was around similar time to onset symptoms - Also on Wellbutrin , and Suboxone   I have reviewed the discharge medication list, and have reconciled the current and discharge medications today.      03/15/2024    8:52 AM 01/22/2024    2:23 PM 04/28/2023   11:47 AM  Depression screen PHQ 2/9  Decreased Interest 0 0 0  Down, Depressed, Hopeless 0 0 0  PHQ - 2 Score 0 0 0  Altered sleeping 0 0 0  Tired, decreased energy 0 0 0  Change in appetite 0 0 0  Feeling bad or failure about yourself  0 0 0  Trouble concentrating 0 0 0  Moving slowly or fidgety/restless 0 0 0  Suicidal thoughts 0 0 0  PHQ-9 Score 0 0 0  Difficult doing work/chores Not difficult at all Not difficult at all Not difficult at all       03/15/2024    8:53 AM 01/22/2024    2:23 PM 04/28/2023   11:47 AM 11/24/2022   10:16 AM  GAD 7 : Generalized Anxiety Score  Nervous, Anxious, on Edge 0 0 0 0  Control/stop worrying 0 0 0 0  Worry too much - different things 0 0 0 0  Trouble relaxing 0 0 0 0  Restless 0 0 0 0  Easily annoyed or irritable 0 0 0 0  Afraid - awful might happen 0 0 0 0  Total GAD 7 Score 0 0 0 0  Anxiety Difficulty Not difficult at all Not difficult at all Not difficult at all Not difficult at all    Social History   Tobacco Use   Smoking status: Former    Current packs/day: 0.00    Average packs/day: 0.5 packs/day for 8.0  years (4.0 ttl pk-yrs)    Types: Cigarettes    Start date: 08/06/2011    Quit date: 08/06/2019    Years since quitting: 4.6   Smokeless tobacco: Never  Vaping Use   Vaping status: Every Day   Start date: 08/06/2019   Last attempt to quit: 03/13/2021  Substance Use Topics   Alcohol  use: No   Drug use: Not Currently    Types: Marijuana    Review of Systems Per HPI unless specifically indicated above     Objective:    BP 130/82 (BP Location: Right Arm, Patient Position: Sitting, Cuff Size: Normal)   Pulse 87   Ht 5\' 9"  (1.753 m)   Wt 172 lb 4 oz (78.1 kg)   SpO2 99%   BMI 25.44 kg/m   Wt Readings from Last 3 Encounters:  03/15/24 172 lb 4 oz (78.1 kg)  03/10/24 175 lb (79.4 kg)  01/22/24 184 lb (83.5 kg)    Physical Exam Vitals and nursing note reviewed.  Constitutional:      General: She is not in acute distress.    Appearance: She is well-developed. She is not diaphoretic.     Comments: Well-appearing, comfortable, cooperative  HENT:     Head: Normocephalic and atraumatic.  Eyes:     General:        Right eye: No discharge.        Left eye: No discharge.     Conjunctiva/sclera: Conjunctivae normal.  Neck:     Thyroid: No thyromegaly.  Cardiovascular:     Rate and Rhythm: Normal rate and regular rhythm.     Heart sounds: Normal heart sounds. No murmur heard. Pulmonary:     Effort: Pulmonary effort is normal. No respiratory distress.     Breath sounds: Normal breath sounds. No wheezing or rales.  Chest:     Chest wall: No tenderness (Non tender on pressure over ribs and chest wall. Deep breath not provoking pain either.).  Abdominal:     General: Bowel sounds are normal. There is no distension.     Palpations: Abdomen is soft. There is no mass.     Tenderness: There is no abdominal tenderness. There is no guarding or rebound.     Hernia: No hernia is present.     Comments: Negative RUQ abdominal pain, Negative Murphy's  Musculoskeletal:        General: Normal  range of motion.     Cervical back: Normal range of motion and neck supple.  Lymphadenopathy:     Cervical: No cervical adenopathy.  Skin:    General: Skin is warm and dry.     Findings: No erythema or rash.  Neurological:     Mental Status: She is alert and oriented to person, place, and time.  Psychiatric:        Behavior: Behavior normal.     Comments: Well groomed, good eye contact, normal speech and thoughts     I have personally reviewed the radiology report from 03/10/24 on CXR , CT .  CLINICAL DATA:  Right-sided chest pain.  Chills and vomiting   EXAM: CHEST - 2 VIEW   COMPARISON:  03/13/2021 plain film and CT   FINDINGS: the inferolateral right hemithorax is minimally excluded from the frontal radiograph. The right costophrenic sulcus is excluded from the lateral radiograph. Midline trachea. Normal heart size and mediastinal contours. No left-sided pleural effusion and no pneumothorax. Visualized lungs are clear.   IMPRESSION: No acute cardiopulmonary disease.   Minimal exclusion of the inferior right lateral right hemithorax.     Electronically Signed   By: Lore Rode M.D.   On: 03/10/2024 09:26  -----------  CLINICAL DATA:  Elevated liver function tests   EXAM: ULTRASOUND ABDOMEN LIMITED RIGHT UPPER QUADRANT   COMPARISON:  None Available.   FINDINGS: Gallbladder:   No gallstones or wall thickening visualized. No sonographic Murphy sign noted by sonographer.   Common bile duct:   Diameter: Normal, 6 mm   Liver:   No focal lesion identified. Within normal limits in parenchymal echogenicity. Portal vein is patent on color Doppler imaging with normal direction of blood flow towards the liver.   Other: None.   IMPRESSION: Normal right upper quadrant ultrasound.     Electronically Signed   By: Lore Rode M.D.   On: 03/10/2024 12:09  -----------------   CLINICAL DATA:  Abdominal/flank pain.   EXAM: CT ABDOMEN AND PELVIS WITHOUT  CONTRAST   TECHNIQUE: Multidetector CT imaging of the abdomen and pelvis was performed following the standard protocol without IV contrast.   RADIATION DOSE REDUCTION: This exam was performed according to the departmental dose-optimization program which includes automated exposure control, adjustment of the mA and/or kV according to patient size and/or use of iterative reconstruction technique.   COMPARISON:  None Available.   FINDINGS: Lower chest: No acute abnormality. No pleural or pericardial effusions.   Hepatobiliary: No focal liver abnormality is seen. No gallstones, gallbladder wall thickening, or biliary dilatation.   Pancreas: Unremarkable. No pancreatic ductal dilatation or surrounding inflammatory changes.   Spleen: Normal in size without focal abnormality.   Adrenals/Urinary Tract: Adrenal glands are unremarkable. Kidneys are normal, without renal calculi, focal lesion, or hydronephrosis. Bladder is unremarkable.   Stomach/Bowel: Stomach is within normal limits. Appendix appears normal. No evidence of bowel wall thickening, distention, or inflammatory changes.   Vascular/Lymphatic: No significant vascular findings are present. No enlarged abdominal or pelvic lymph nodes.   Reproductive: Uterus and bilateral adnexa are unremarkable.   Other: No abdominal wall hernia or abnormality. No abdominopelvic ascites.   Musculoskeletal: No acute or significant osseous findings.   IMPRESSION: Unremarkable examination of the abdomen and pelvis.     Electronically Signed   By: Sydell Eva M.D.   On: 03/10/2024 13:26  Results for orders placed or performed during the hospital encounter of 03/10/24  Basic metabolic panel   Collection Time: 03/10/24  9:00 AM  Result Value Ref Range   Sodium 135 135 - 145 mmol/L   Potassium 3.8 3.5 - 5.1 mmol/L   Chloride 103 98 - 111 mmol/L   CO2 26 22 - 32 mmol/L   Glucose, Bld 115 (H) 70 - 99 mg/dL   BUN 16 6 - 20 mg/dL    Creatinine, Ser 4.09  0.44 - 1.00 mg/dL   Calcium 9.1 8.9 - 93.8 mg/dL   GFR, Estimated >10 >17 mL/min   Anion gap 6 5 - 15  CBC   Collection Time: 03/10/24  9:00 AM  Result Value Ref Range   WBC 5.1 4.0 - 10.5 K/uL   RBC 4.20 3.87 - 5.11 MIL/uL   Hemoglobin 13.1 12.0 - 15.0 g/dL   HCT 51.0 25.8 - 52.7 %   MCV 93.3 80.0 - 100.0 fL   MCH 31.2 26.0 - 34.0 pg   MCHC 33.4 30.0 - 36.0 g/dL   RDW 78.2 42.3 - 53.6 %   Platelets 182 150 - 400 K/uL   nRBC 0.0 0.0 - 0.2 %  Lipase, blood   Collection Time: 03/10/24  9:00 AM  Result Value Ref Range   Lipase 29 11 - 51 U/L  hCG, quantitative, pregnancy   Collection Time: 03/10/24  9:00 AM  Result Value Ref Range   hCG, Beta Chain, Quant, S <1 <5 mIU/mL  Troponin I (High Sensitivity)   Collection Time: 03/10/24  9:00 AM  Result Value Ref Range   Troponin I (High Sensitivity) 2 <18 ng/L  Hepatic function panel   Collection Time: 03/10/24  9:19 AM  Result Value Ref Range   Total Protein 6.8 6.5 - 8.1 g/dL   Albumin 4.2 3.5 - 5.0 g/dL   AST 25 15 - 41 U/L   ALT 26 0 - 44 U/L   Alkaline Phosphatase 35 (L) 38 - 126 U/L   Total Bilirubin 1.7 (H) 0.0 - 1.2 mg/dL   Bilirubin, Direct 0.2 0.0 - 0.2 mg/dL   Indirect Bilirubin 1.5 (H) 0.3 - 0.9 mg/dL  Urinalysis, w/ Reflex to Culture (Infection Suspected) -Urine, Clean Catch   Collection Time: 03/10/24 10:38 AM  Result Value Ref Range   Specimen Source URINE, CLEAN CATCH    Color, Urine STRAW (A) YELLOW   APPearance CLEAR (A) CLEAR   Specific Gravity, Urine 1.008 1.005 - 1.030   pH 8.0 5.0 - 8.0   Glucose, UA NEGATIVE NEGATIVE mg/dL   Hgb urine dipstick NEGATIVE NEGATIVE   Bilirubin Urine NEGATIVE NEGATIVE   Ketones, ur NEGATIVE NEGATIVE mg/dL   Protein, ur NEGATIVE NEGATIVE mg/dL   Nitrite NEGATIVE NEGATIVE   Leukocytes,Ua NEGATIVE NEGATIVE   RBC / HPF 0 0 - 5 RBC/hpf   WBC, UA 0-5 0 - 5 WBC/hpf   Bacteria, UA NONE SEEN NONE SEEN   Squamous Epithelial / HPF 0-5 0 - 5 /HPF       Assessment & Plan:   Problem List Items Addressed This Visit   None Visit Diagnoses       Nausea    -  Primary   Relevant Medications   ondansetron  (ZOFRAN -ODT) 4 MG disintegrating tablet     Costochondritis       Relevant Medications   naproxen  (NAPROSYN ) 500 MG tablet     H/O motion sickness       Relevant Medications   ondansetron  (ZOFRAN -ODT) 4 MG disintegrating tablet     Right flank pain       Relevant Medications   naproxen  (NAPROSYN ) 500 MG tablet     Gastroesophageal reflux disease without esophagitis       Relevant Medications   ondansetron  (ZOFRAN -ODT) 4 MG disintegrating tablet   omeprazole (PRILOSEC) 40 MG capsule        Flank Pain in right rib area / Costochondritis ED Visit reviewed, including imaging  and labs She had  elevated T Bili, not LFTs. No significant lab results to explain. No urinary symptoms or hematuria  Intermittent sharp pain under right rib, not linked to movement or meals. Previous imaging normal, . Differential includes costochondritis, pleurisy, or gallbladder dysfunction. Pain relief with Toradol  suggests musculoskeletal component.  - Prescribe naproxen  12-hour for 2-4 weeks, take with meals. - Prescribe omeprazole to prevent stomach irritation from naproxen .  Cannot fully rule out gallbladder dysfunction (acalculous) She has fam history of this as well. - Consider HIDA scan if symptoms persist or worsen.  Nausea Chronic nausea, worsened by recent medication changes. Improved with Zofran . Trintellix may contribute to symptoms based on side effect profile - Refill Zofran  for nausea management. - Report nausea to Trintellix prescriber.  Depression Followed by Psychiatry. Managed with Wellbutrin  and Trintellix. Trintellix effective for mood but may cause nausea. - Continue Trintellix and Wellbutrin  as prescribed.  Basal cell carcinoma Scheduled for surgical excision on June 24. Biopsy completed. - Proceed with scheduled surgical  excision on June 24.        No orders of the defined types were placed in this encounter.   Meds ordered this encounter  Medications   ondansetron  (ZOFRAN -ODT) 4 MG disintegrating tablet    Sig: Take 1 tablet (4 mg total) by mouth every 8 (eight) hours as needed for nausea or vomiting (motion sickness).    Dispense:  30 tablet    Refill:  2   naproxen  (NAPROSYN ) 500 MG tablet    Sig: Take 1 tablet (500 mg total) by mouth 2 (two) times daily with a meal. For 2-4 weeks then as needed    Dispense:  60 tablet    Refill:  0   omeprazole (PRILOSEC) 40 MG capsule    Sig: Take 1 capsule (40 mg total) by mouth daily before breakfast.    Dispense:  30 capsule    Refill:  0    Follow up plan: Return if symptoms worsen or fail to improve.   Domingo Friend, DO [facility] 03/15/2024, 9:09 AM

## 2024-03-15 NOTE — Patient Instructions (Addendum)
 Thank you for coming to the office today.  VISIT SUMMARY:  Today, we addressed your right Rib pain, nausea, and mood/anxiety. We also discussed your upcoming surgery for basal cell carcinoma.  YOUR PLAN:  PAIN IN RIGHT RIB AREA: You have been experiencing intermittent sharp pain under your right rib area. Previous imaging was normal, but the pain relief with Toradol  suggests a musculoskeletal issue. -Take naproxen  12-hour for 2-4 weeks with meals to help manage the pain.- - Take with food -If symptoms persist or worsen, we may consider a HIDA scan. -Take omeprazole to prevent stomach irritation from naproxen .  NAUSEA: You have been experiencing chronic nausea, which has worsened with recent medication changes. Zofran  has been effective in managing this symptom. -Continue taking Zofran  for nausea management. -Report the nausea to the prescriber of Trintellix.  DEPRESSION: Your depression is being managed with Wellbutrin  and Trintellix. Trintellix has been effective for your mood but may be causing nausea. -Continue taking Trintellix and Wellbutrin  as prescribed.  BASAL CELL CARCINOMA: You are scheduled for surgical excision of basal cell carcinoma on June 24. -Proceed with the scheduled surgical excision on June 24.  Please schedule a Follow-up Appointment to: Return if symptoms worsen or fail to improve.  If you have any other questions or concerns, please feel free to call the office or send a message through MyChart. You may also schedule an earlier appointment if necessary.  Additionally, you may be receiving a survey about your experience at our office within a few days to 1 week by e-mail or mail. We value your feedback.  Domingo Friend, DO [facility], [place]

## 2024-03-21 DIAGNOSIS — F331 Major depressive disorder, recurrent, moderate: Secondary | ICD-10-CM | POA: Diagnosis not present

## 2024-03-21 DIAGNOSIS — F900 Attention-deficit hyperactivity disorder, predominantly inattentive type: Secondary | ICD-10-CM | POA: Diagnosis not present

## 2024-03-25 DIAGNOSIS — Z419 Encounter for procedure for purposes other than remedying health state, unspecified: Secondary | ICD-10-CM | POA: Diagnosis not present

## 2024-04-11 ENCOUNTER — Other Ambulatory Visit: Payer: Self-pay | Admitting: Family Medicine

## 2024-04-11 DIAGNOSIS — K219 Gastro-esophageal reflux disease without esophagitis: Secondary | ICD-10-CM

## 2024-04-11 DIAGNOSIS — R109 Unspecified abdominal pain: Secondary | ICD-10-CM

## 2024-04-11 DIAGNOSIS — M940 Chondrocostal junction syndrome [Tietze]: Secondary | ICD-10-CM

## 2024-04-12 NOTE — Telephone Encounter (Signed)
 Too soon for refill.  Requested Prescriptions  Pending Prescriptions Disp Refills   naproxen  (NAPROSYN ) 500 MG tablet [Pharmacy Med Name: NAPROXEN  500 MG TABLET] 60 tablet 0    Sig: TAKE 1 TABLET (500 MG TOTAL) BY MOUTH 2 (TWO) TIMES DAILY WITH A MEAL. FOR 2-4 WEEKS THEN AS NEEDED     Analgesics:  NSAIDS Failed - 04/12/2024  4:20 PM      Failed - Manual Review: Labs are only required if the patient has taken medication for more than 8 weeks.      Failed - Valid encounter within last 12 months    Recent Outpatient Visits           4 weeks ago Nausea   [facility] [place], Kayleen Party, DO   2 months ago Neoplasm of uncertain behavior of skin   [place] [place], Kayleen Party, DO              Passed - Cr in normal range and within 360 days    Creatinine  Date Value Ref Range Status  11/26/2014 0.70 0.60 - 1.30 mg/dL Final   Creatinine, Ser  Date Value Ref Range Status  03/10/2024 0.87 0.44 - 1.00 mg/dL Final         Passed - HGB in normal range and within 360 days    Hemoglobin  Date Value Ref Range Status  03/10/2024 13.1 12.0 - 15.0 g/dL Final  16/08/9603 54.0 (L) 11.1 - 15.9 g/dL Final         Passed - PLT in normal range and within 360 days    Platelets  Date Value Ref Range Status  03/10/2024 182 150 - 400 K/uL Final  05/27/2019 173 150 - 450 x10E3/uL Final         Passed - HCT in normal range and within 360 days    HCT  Date Value Ref Range Status  03/10/2024 39.2 36.0 - 46.0 % Final   Hematocrit  Date Value Ref Range Status  05/27/2019 31.4 (L) 34.0 - 46.6 % Final         Passed - eGFR is 30 or above and within 360 days    EGFR (African American)  Date Value Ref Range Status  11/26/2014 >60 >62mL/min Final  03/09/2012 >60  Final   GFR calc Af Amer  Date Value Ref Range Status  07/22/2018 >60 >60 mL/min Final    Comment:    (NOTE) The eGFR has been calculated using the CKD EPI  equation. This calculation has not been validated in all clinical situations. eGFR's persistently <60 mL/min signify possible Chronic Kidney Disease.    EGFR (Non-African Amer.)  Date Value Ref Range Status  11/26/2014 >60 >55mL/min Final    Comment:    eGFR values <55mL/min/1.73 m2 may be an indication of chronic kidney disease (CKD). Calculated eGFR, using the MRDR Study equation, is useful in  patients with stable renal function. The eGFR calculation will not be reliable in acutely ill patients when serum creatinine is changing rapidly. It is not useful in patients on dialysis. The eGFR calculation may not be applicable to patients at the low and high extremes of body sizes, pregnant women, and vegetarians.   03/09/2012 >60  Final    Comment:    eGFR values <71mL/min/1.73 m2 may be an indication of chronic kidney disease (CKD). Calculated eGFR is useful in patients with stable renal function. The eGFR calculation will not be  reliable in acutely ill patients when serum creatinine is changing rapidly. It is not useful in  patients on dialysis. The eGFR calculation may not be applicable to patients at the low and high extremes of body sizes, pregnant women, and vegetarians.    GFR, Estimated  Date Value Ref Range Status  03/10/2024 >60 >60 mL/min Final    Comment:    (NOTE) Calculated using the CKD-EPI Creatinine Equation (2021)          Passed - Patient is not pregnant       omeprazole  (PRILOSEC) 40 MG capsule [Pharmacy Med Name: OMEPRAZOLE  DR 40 MG CAPSULE] 30 capsule 0    Sig: TAKE 1 CAPSULE BY MOUTH EVERY DAY BEFORE BREAKFAST     Gastroenterology: Proton Pump Inhibitors Failed - 04/12/2024  4:20 PM      Failed - Valid encounter within last 12 months    Recent Outpatient Visits           4 weeks ago Nausea   [facility] Raina Bunting, DO   2 months ago Neoplasm of uncertain behavior of skin   [place] [facility] [place], Kayleen Party, [place]

## 2024-04-25 DIAGNOSIS — Z419 Encounter for procedure for purposes other than remedying health state, unspecified: Secondary | ICD-10-CM | POA: Diagnosis not present

## 2024-05-07 DIAGNOSIS — C44319 Basal cell carcinoma of skin of other parts of face: Secondary | ICD-10-CM | POA: Diagnosis not present

## 2024-05-23 DIAGNOSIS — F900 Attention-deficit hyperactivity disorder, predominantly inattentive type: Secondary | ICD-10-CM | POA: Diagnosis not present

## 2024-05-23 DIAGNOSIS — F331 Major depressive disorder, recurrent, moderate: Secondary | ICD-10-CM | POA: Diagnosis not present

## 2024-05-25 DIAGNOSIS — Z419 Encounter for procedure for purposes other than remedying health state, unspecified: Secondary | ICD-10-CM | POA: Diagnosis not present

## 2024-06-25 DIAGNOSIS — Z419 Encounter for procedure for purposes other than remedying health state, unspecified: Secondary | ICD-10-CM | POA: Diagnosis not present

## 2024-07-19 DIAGNOSIS — Z79899 Other long term (current) drug therapy: Secondary | ICD-10-CM | POA: Diagnosis not present

## 2024-07-19 DIAGNOSIS — F331 Major depressive disorder, recurrent, moderate: Secondary | ICD-10-CM | POA: Diagnosis not present

## 2024-07-19 DIAGNOSIS — F900 Attention-deficit hyperactivity disorder, predominantly inattentive type: Secondary | ICD-10-CM | POA: Diagnosis not present

## 2024-07-26 DIAGNOSIS — Z419 Encounter for procedure for purposes other than remedying health state, unspecified: Secondary | ICD-10-CM | POA: Diagnosis not present

## 2024-09-17 DIAGNOSIS — Z85828 Personal history of other malignant neoplasm of skin: Secondary | ICD-10-CM | POA: Diagnosis not present

## 2024-09-17 DIAGNOSIS — D2261 Melanocytic nevi of right upper limb, including shoulder: Secondary | ICD-10-CM | POA: Diagnosis not present

## 2024-09-17 DIAGNOSIS — D2272 Melanocytic nevi of left lower limb, including hip: Secondary | ICD-10-CM | POA: Diagnosis not present

## 2024-09-17 DIAGNOSIS — D2271 Melanocytic nevi of right lower limb, including hip: Secondary | ICD-10-CM | POA: Diagnosis not present

## 2024-09-17 DIAGNOSIS — L218 Other seborrheic dermatitis: Secondary | ICD-10-CM | POA: Diagnosis not present

## 2024-09-17 DIAGNOSIS — Z08 Encounter for follow-up examination after completed treatment for malignant neoplasm: Secondary | ICD-10-CM | POA: Diagnosis not present

## 2024-09-17 DIAGNOSIS — D225 Melanocytic nevi of trunk: Secondary | ICD-10-CM | POA: Diagnosis not present

## 2024-09-17 DIAGNOSIS — D2262 Melanocytic nevi of left upper limb, including shoulder: Secondary | ICD-10-CM | POA: Diagnosis not present

## 2024-09-19 DIAGNOSIS — F900 Attention-deficit hyperactivity disorder, predominantly inattentive type: Secondary | ICD-10-CM | POA: Diagnosis not present

## 2024-09-19 DIAGNOSIS — Z79899 Other long term (current) drug therapy: Secondary | ICD-10-CM | POA: Diagnosis not present
